# Patient Record
Sex: Female | Born: 1975 | Race: White | Hispanic: No | Marital: Married | Smoking: Never smoker
Health system: Southern US, Community
[De-identification: ages and names within clinical notes are randomized; demographics above are authoritative.]

## PROBLEM LIST (undated history)

## (undated) ENCOUNTER — Inpatient Hospital Stay (HOSPITAL_COMMUNITY): Payer: Self-pay

## (undated) DIAGNOSIS — Z8619 Personal history of other infectious and parasitic diseases: Secondary | ICD-10-CM

## (undated) DIAGNOSIS — K900 Celiac disease: Secondary | ICD-10-CM

## (undated) DIAGNOSIS — Z9889 Other specified postprocedural states: Secondary | ICD-10-CM

## (undated) DIAGNOSIS — D649 Anemia, unspecified: Secondary | ICD-10-CM

## (undated) DIAGNOSIS — R112 Nausea with vomiting, unspecified: Secondary | ICD-10-CM

## (undated) DIAGNOSIS — Z8739 Personal history of other diseases of the musculoskeletal system and connective tissue: Secondary | ICD-10-CM

## (undated) DIAGNOSIS — IMO0002 Reserved for concepts with insufficient information to code with codable children: Secondary | ICD-10-CM

## (undated) DIAGNOSIS — R87619 Unspecified abnormal cytological findings in specimens from cervix uteri: Secondary | ICD-10-CM

## (undated) HISTORY — DX: Celiac disease: K90.0

## (undated) HISTORY — DX: Unspecified abnormal cytological findings in specimens from cervix uteri: R87.619

## (undated) HISTORY — PX: COLPOSCOPY: SHX161

## (undated) HISTORY — DX: Personal history of other diseases of the musculoskeletal system and connective tissue: Z87.39

## (undated) HISTORY — DX: Anemia, unspecified: D64.9

## (undated) HISTORY — DX: Personal history of other infectious and parasitic diseases: Z86.19

## (undated) HISTORY — DX: Reserved for concepts with insufficient information to code with codable children: IMO0002

---

## 2002-12-27 ENCOUNTER — Encounter: Payer: Self-pay | Admitting: Emergency Medicine

## 2002-12-27 ENCOUNTER — Emergency Department (HOSPITAL_COMMUNITY): Admission: EM | Admit: 2002-12-27 | Discharge: 2002-12-27 | Payer: Self-pay | Admitting: Emergency Medicine

## 2005-06-12 ENCOUNTER — Ambulatory Visit: Payer: Self-pay | Admitting: Family Medicine

## 2005-06-14 ENCOUNTER — Ambulatory Visit (HOSPITAL_COMMUNITY): Admission: RE | Admit: 2005-06-14 | Discharge: 2005-06-14 | Payer: Self-pay | Admitting: Family Medicine

## 2005-07-08 ENCOUNTER — Ambulatory Visit: Payer: Self-pay | Admitting: Family Medicine

## 2005-09-11 ENCOUNTER — Ambulatory Visit: Payer: Self-pay | Admitting: Internal Medicine

## 2005-09-16 ENCOUNTER — Ambulatory Visit (HOSPITAL_COMMUNITY): Admission: RE | Admit: 2005-09-16 | Discharge: 2005-09-16 | Payer: Self-pay | Admitting: Internal Medicine

## 2006-06-29 IMAGING — CR DG CHEST 2V
2 series · 2 of 2 positions shown · non-contrast
Comparison: none

HISTORY: Cough, positive M SAIFUR and elevated sedimentation rate

CHEST 2 VIEWS:
No prior exam for comparison
Normal heart size, mediastinal contours, and vascularity.
Lungs clear.
No effusion or pneumothorax.
Bones unremarkable.

[view not recorded (1 of 2)]
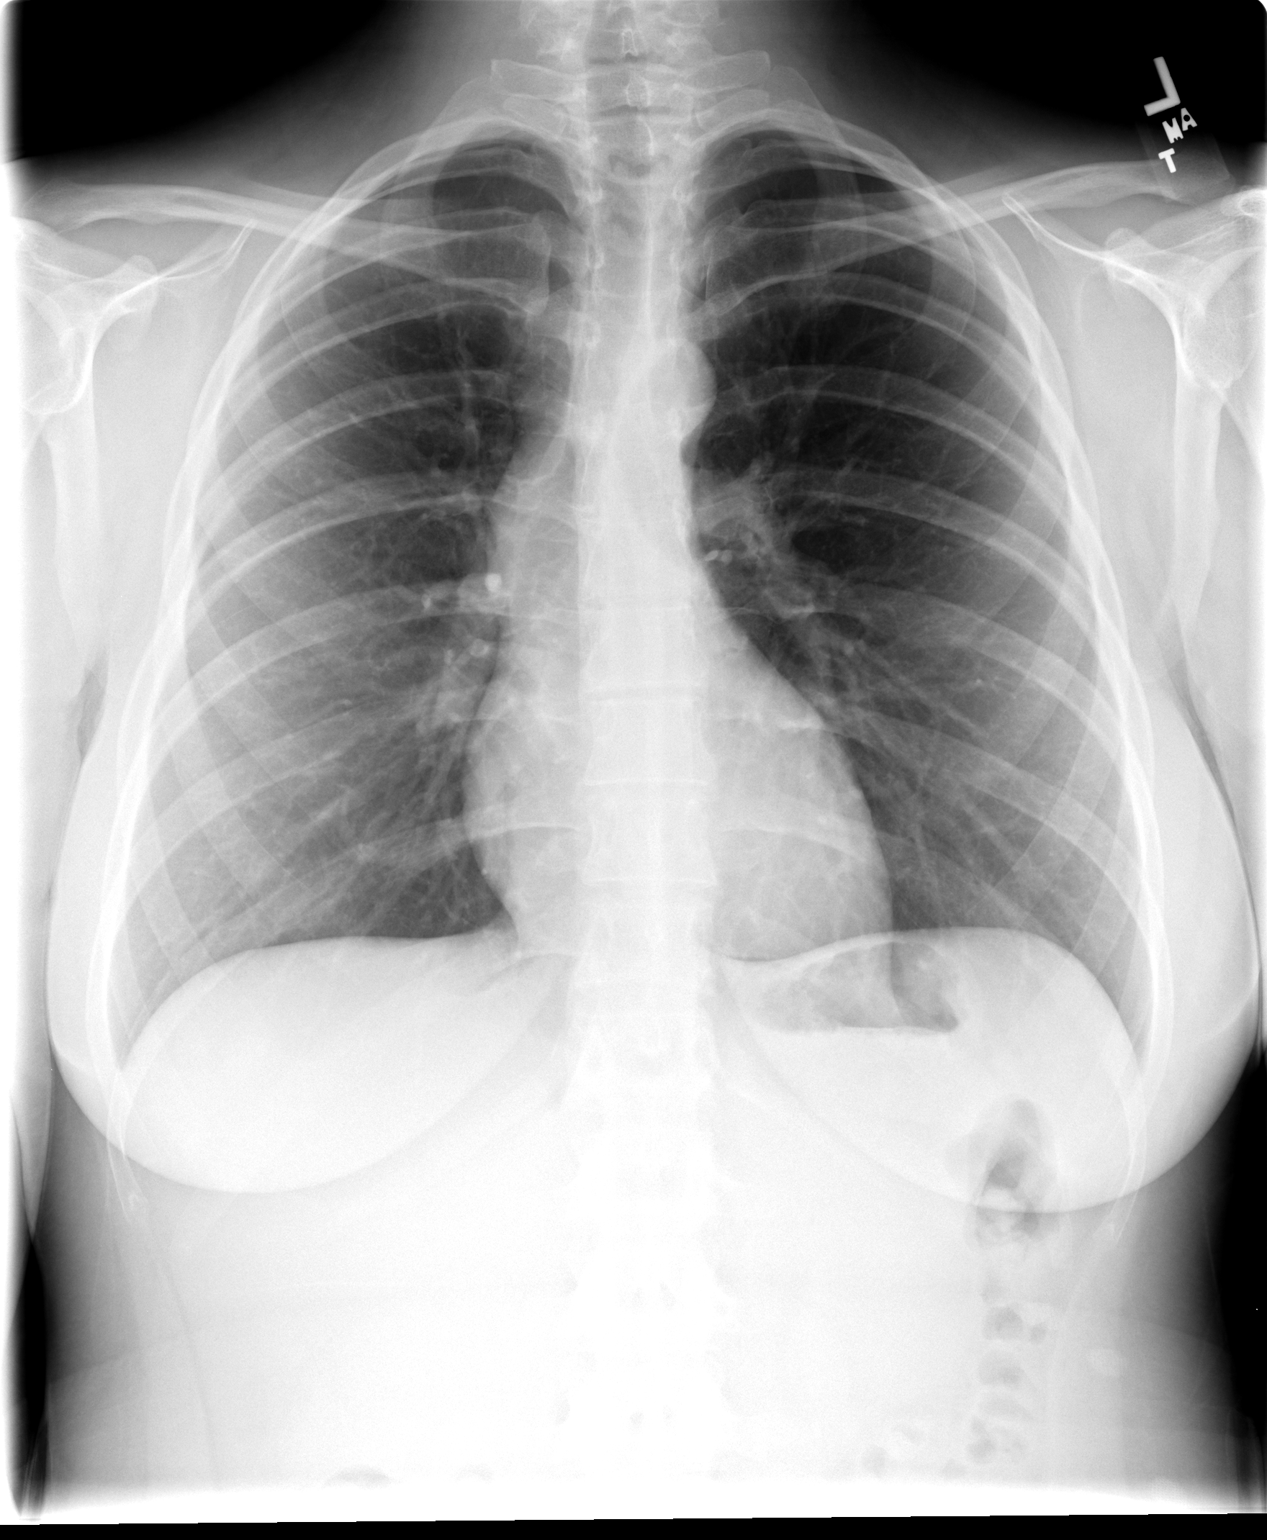

[view not recorded (2 of 2)]
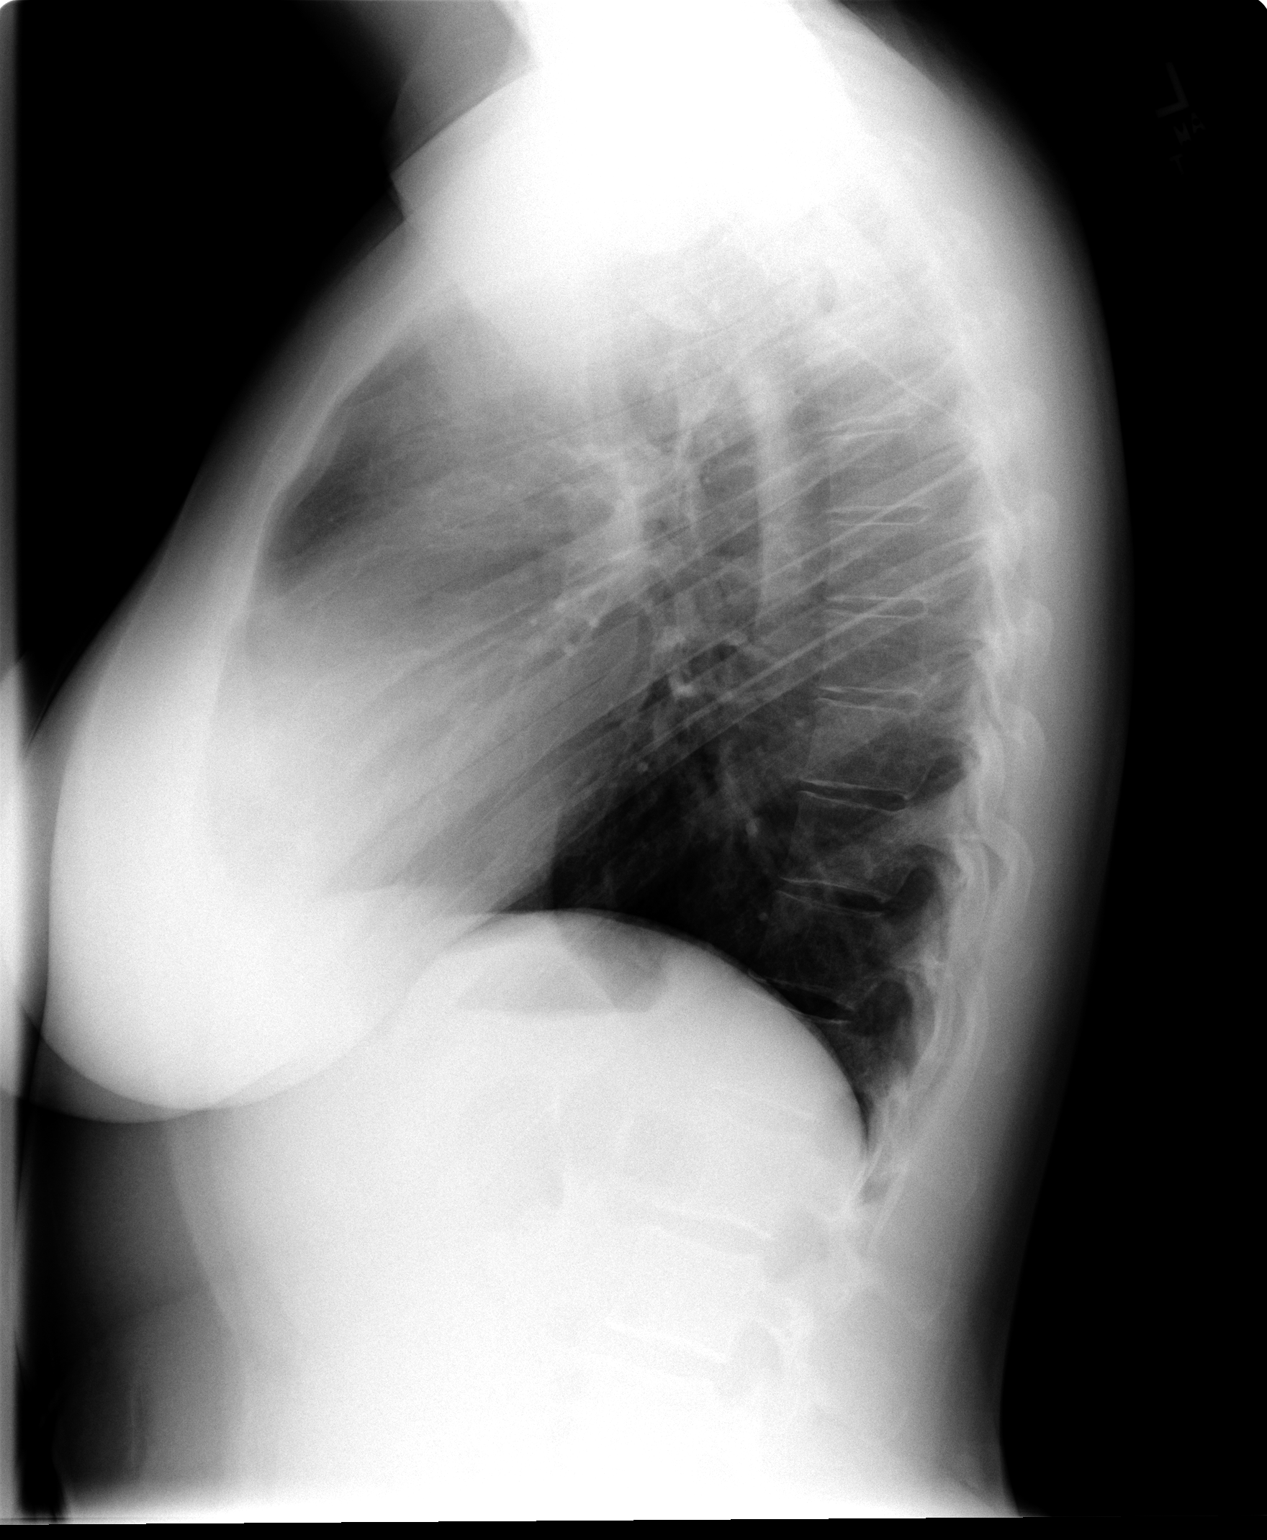

[2 of 2 positions shown; findings below may reference images not displayed]

IMPRESSION: No acute abnormalities.

## 2006-07-02 ENCOUNTER — Emergency Department (HOSPITAL_COMMUNITY): Admission: EM | Admit: 2006-07-02 | Discharge: 2006-07-02 | Payer: Self-pay | Admitting: Family Medicine

## 2006-11-26 ENCOUNTER — Emergency Department (HOSPITAL_COMMUNITY): Admission: EM | Admit: 2006-11-26 | Discharge: 2006-11-26 | Payer: Self-pay | Admitting: Family Medicine

## 2007-05-22 ENCOUNTER — Emergency Department (HOSPITAL_COMMUNITY): Admission: EM | Admit: 2007-05-22 | Discharge: 2007-05-22 | Payer: Self-pay | Admitting: Emergency Medicine

## 2007-08-18 ENCOUNTER — Emergency Department (HOSPITAL_COMMUNITY): Admission: EM | Admit: 2007-08-18 | Discharge: 2007-08-18 | Payer: Self-pay | Admitting: Emergency Medicine

## 2007-10-19 ENCOUNTER — Emergency Department (HOSPITAL_COMMUNITY): Admission: EM | Admit: 2007-10-19 | Discharge: 2007-10-19 | Payer: Self-pay | Admitting: Family Medicine

## 2008-10-13 ENCOUNTER — Emergency Department (HOSPITAL_COMMUNITY): Admission: EM | Admit: 2008-10-13 | Discharge: 2008-10-13 | Payer: Self-pay | Admitting: Family Medicine

## 2009-04-26 ENCOUNTER — Emergency Department (HOSPITAL_COMMUNITY): Admission: EM | Admit: 2009-04-26 | Discharge: 2009-04-26 | Payer: Self-pay | Admitting: Family Medicine

## 2009-12-27 ENCOUNTER — Inpatient Hospital Stay (HOSPITAL_COMMUNITY): Admission: AD | Admit: 2009-12-27 | Discharge: 2009-12-27 | Payer: Self-pay | Admitting: Obstetrics and Gynecology

## 2010-01-03 ENCOUNTER — Encounter (INDEPENDENT_AMBULATORY_CARE_PROVIDER_SITE_OTHER): Payer: Self-pay | Admitting: Obstetrics and Gynecology

## 2010-01-03 ENCOUNTER — Inpatient Hospital Stay (HOSPITAL_COMMUNITY): Admission: RE | Admit: 2010-01-03 | Discharge: 2010-01-06 | Payer: Self-pay | Admitting: Obstetrics and Gynecology

## 2010-10-29 ENCOUNTER — Other Ambulatory Visit: Payer: Self-pay | Admitting: Dermatology

## 2010-11-27 LAB — COMPREHENSIVE METABOLIC PANEL
ALT: 16 U/L (ref 0–35)
AST: 20 U/L (ref 0–37)
Albumin: 2.9 g/dL — ABNORMAL LOW (ref 3.5–5.2)
Alkaline Phosphatase: 89 U/L (ref 39–117)
BUN: 9 mg/dL (ref 6–23)
CO2: 24 mEq/L (ref 19–32)
Calcium: 9.6 mg/dL (ref 8.4–10.5)
Chloride: 103 mEq/L (ref 96–112)
Creatinine, Ser: 0.77 mg/dL (ref 0.4–1.2)
GFR calc Af Amer: >60 mL/min (ref >60)
GFR calc non Af Amer: >60 mL/min (ref >60)
Glucose, Bld: 79 mg/dL (ref 70–99)
Potassium: 4 mEq/L (ref 3.5–5.1)
Sodium: 135 mEq/L (ref 135–145)
Total Bilirubin: 0.2 mg/dL — ABNORMAL LOW (ref 0.3–1.2)
Total Protein: 7 g/dL (ref 6.0–8.3)

## 2010-11-27 LAB — CBC
HCT: 20.7 % — ABNORMAL LOW (ref 36.0–46.0)
HCT: 27.9 % — ABNORMAL LOW (ref 36.0–46.0)
HCT: 28.7 % — ABNORMAL LOW (ref 36.0–46.0)
Hemoglobin: 7.3 g/dL — ABNORMAL LOW (ref 12.0–15.0)
Hemoglobin: 9.5 g/dL — ABNORMAL LOW (ref 12.0–15.0)
Hemoglobin: 9.7 g/dL — ABNORMAL LOW (ref 12.0–15.0)
MCHC: 33.7 g/dL (ref 30.0–36.0)
MCHC: 34.1 g/dL (ref 30.0–36.0)
MCHC: 35.1 g/dL (ref 30.0–36.0)
MCV: 86.3 fL (ref 78.0–100.0)
MCV: 86.7 fL (ref 78.0–100.0)
MCV: 88.6 fL (ref 78.0–100.0)
Platelets: 141 10*3/uL — ABNORMAL LOW (ref 150–400)
Platelets: 192 10*3/uL (ref 150–400)
Platelets: 203 10*3/uL (ref 150–400)
RBC: 2.34 MIL/uL — ABNORMAL LOW (ref 3.87–5.11)
RBC: 3.22 MIL/uL — ABNORMAL LOW (ref 3.87–5.11)
RBC: 3.33 MIL/uL — ABNORMAL LOW (ref 3.87–5.11)
RDW: 16 % — ABNORMAL HIGH (ref 11.5–15.5)
RDW: 16 % — ABNORMAL HIGH (ref 11.5–15.5)
RDW: 16.3 % — ABNORMAL HIGH (ref 11.5–15.5)
WBC: 12.4 10*3/uL — ABNORMAL HIGH (ref 4.0–10.5)
WBC: 13 10*3/uL — ABNORMAL HIGH (ref 4.0–10.5)
WBC: 13.4 10*3/uL — ABNORMAL HIGH (ref 4.0–10.5)

## 2010-11-27 LAB — CROSSMATCH
ABO/RH(D): A POS
Antibody Screen: NEGATIVE

## 2010-11-27 LAB — LACTATE DEHYDROGENASE: LDH: 124 U/L (ref 94–250)

## 2010-11-27 LAB — ABO/RH: ABO/RH(D): A POS

## 2010-11-27 LAB — RPR: RPR Ser Ql: NONREACTIVE

## 2010-11-27 LAB — URIC ACID: Uric Acid, Serum: 5.9 mg/dL (ref 2.4–7.0)

## 2010-12-15 LAB — POCT PREGNANCY, URINE: Preg Test, Ur: NEGATIVE

## 2010-12-25 LAB — POCT RAPID STREP A (OFFICE): Streptococcus, Group A Screen (Direct): NEGATIVE

## 2011-06-17 LAB — I-STAT 8, (EC8 V) (CONVERTED LAB)
Acid-base deficit: 2
BUN: 14
Bicarbonate: 23.9
Chloride: 104
Glucose, Bld: 142 — ABNORMAL HIGH
HCT: 39
Hemoglobin: 13.3
Operator id: 116391
Potassium: 3.6
Sodium: 137
TCO2: 25
pCO2, Ven: 43.8 — ABNORMAL LOW
pH, Ven: 7.345 — ABNORMAL HIGH

## 2011-06-17 LAB — DIFFERENTIAL
Basophils Absolute: 0
Basophils Relative: 1
Eosinophils Absolute: 0.1 — ABNORMAL LOW
Eosinophils Relative: 2
Lymphocytes Relative: 28
Lymphs Abs: 1
Monocytes Absolute: 0.4
Monocytes Relative: 12
Neutro Abs: 2.1
Neutrophils Relative %: 58

## 2011-06-17 LAB — POCT I-STAT CREATININE
Creatinine, Ser: 0.9
Operator id: 116391

## 2011-06-17 LAB — CBC
HCT: 35.8 — ABNORMAL LOW
Hemoglobin: 12.1
MCHC: 33.7
MCV: 83.7
Platelets: 241
RBC: 4.28
RDW: 13.1
WBC: 3.6 — ABNORMAL LOW

## 2011-06-21 LAB — POCT RAPID STREP A: Streptococcus, Group A Screen (Direct): NEGATIVE

## 2011-07-19 LAB — OB RESULTS CONSOLE HEPATITIS B SURFACE ANTIGEN: Hepatitis B Surface Ag: NEGATIVE

## 2011-07-19 LAB — OB RESULTS CONSOLE ABO/RH
RH Type: NEGATIVE
RH Type: POSITIVE

## 2011-07-19 LAB — OB RESULTS CONSOLE GC/CHLAMYDIA
Chlamydia: NEGATIVE
Gonorrhea: NEGATIVE

## 2011-07-19 LAB — OB RESULTS CONSOLE HIV ANTIBODY (ROUTINE TESTING): HIV: NONREACTIVE

## 2011-07-19 LAB — OB RESULTS CONSOLE RUBELLA ANTIBODY, IGM: Rubella: IMMUNE

## 2011-07-19 LAB — OB RESULTS CONSOLE RPR: RPR: NONREACTIVE

## 2012-01-20 LAB — OB RESULTS CONSOLE GBS: GBS: NEGATIVE

## 2012-02-07 ENCOUNTER — Encounter (HOSPITAL_COMMUNITY): Payer: Self-pay | Admitting: *Deleted

## 2012-02-07 ENCOUNTER — Telehealth (HOSPITAL_COMMUNITY): Payer: Self-pay | Admitting: *Deleted

## 2012-02-07 NOTE — Telephone Encounter (Signed)
Preadmission screen  

## 2012-02-08 ENCOUNTER — Other Ambulatory Visit (HOSPITAL_COMMUNITY): Payer: Self-pay | Admitting: Obstetrics and Gynecology

## 2012-02-10 ENCOUNTER — Observation Stay (HOSPITAL_COMMUNITY)
Admission: RE | Admit: 2012-02-10 | Discharge: 2012-02-10 | Disposition: A | Discharge status: Home / Self Care | Payer: Commercial Managed Care - PPO | Source: Ambulatory Visit | Attending: Obstetrics and Gynecology | Admitting: Obstetrics and Gynecology

## 2012-02-10 ENCOUNTER — Encounter (HOSPITAL_COMMUNITY): Payer: Self-pay

## 2012-02-10 DIAGNOSIS — O321XX0 Maternal care for breech presentation, not applicable or unspecified: Principal | ICD-10-CM | POA: Insufficient documentation

## 2012-02-10 MED ORDER — LACTATED RINGERS IV SOLN: INTRAVENOUS | Status: DC

## 2012-02-10 NOTE — Progress Notes (Signed)
Patient ID: Cristina Armstrong, female   DOB: 26-May-1976, 36 y.o.   MRN: [id number] [redacted][id number][redacted]d   Presents for presumed ECV, bedside US>>vtx, cx 1-2/long/post  Discussed LD pit induction for VBAC at 39 weeks, our office will sched

## 2012-02-10 NOTE — Progress Notes (Signed)
Pt to be discharged home; waiting for Dr. Marcelle Overlie to place discharge orders

## 2012-02-12 NOTE — H&P (Signed)
NAMEJOSLYN, RAMOS NO.:  [id number]  MEDICAL RECORD NO.:  [id number]  LOCATION:  9172                          FACILITY:  [facility]  PHYSICIAN:  Duke Salvia. Marcelle Overlie, M.D.DATE OF BIRTH:  Dec 11, 1975  DATE OF ADMISSION:  02/10/2012 DATE OF DISCHARGE:  02/10/2012                             HISTORY & PHYSICAL   CHIEF COMPLAINT:  Transverse presentation on near term.  HISTORY OF PRESENT ILLNESS:  A 36 year old, G2, P1, previous cesarean section on 2011 for a low-lying placenta, transverse lie, turned out to be vertex with the day of section.  The patient is now living in [place], but Cristina Armstrong is unable to accommodate VBAC, so she plans to attempt VBAC here in [place].  Her prenatal course has otherwise been uneventful. GBS was negative, but it was noted recently to have a transverse lie, was scheduled for outpatient ECV.  PAST MEDICAL HISTORY:  Please see the her [facility] form for details.  PHYSICAL EXAMINATION:  VITAL SIGNS:  Temp 98.2, blood pressure 130/84. HEENT:  Unremarkable. NECK:  Supple without masses. LUNGS:  Clear. CARDIOVASCULAR:  Regular rate and rhythm without murmurs, rubs, or gallops noted. BREASTS:  Not examined, term fundal height.  Fetal heart rate 140. Cervix was closed. EXTREMITIES:  Unremarkable. NEUROLOGIC:  Unremarkable.  IMPRESSION: 1. A 38-week intrauterine pregnancy, previous cesarean section,     desires vaginal birth after cesarean section. 2. Transverse lie.  PLAN:  ECV procedure and risks discussed.  ADDENDUM:  When presenting to L and D for proposed ECV, bedside ultrasound confirmed, vertex presentation.  The cervix was 1-2, 25%, vertex, -2.  Membranes intact.  Fetal monitoring was stable.  She was sent to home to have scheduled outpatient induction.     Alvin Rubano M. Marcelle Overlie, M.D.    RMH/MEDQ  D:  02/12/2012  T:  02/12/2012  Job:  [id number]

## 2012-02-12 NOTE — H&P (Signed)
Cristina Armstrong  DICTATION # [id number] CSN# [id number]   Meriel Pica, MD 02/12/2012 9:18 PM

## 2012-02-14 ENCOUNTER — Inpatient Hospital Stay (HOSPITAL_COMMUNITY)
Admission: RE | Admit: 2012-02-14 | Discharge: 2012-02-17 | DRG: 766 | Disposition: A | Discharge status: Home / Self Care | Payer: Commercial Managed Care - PPO | Source: Ambulatory Visit | Attending: Obstetrics and Gynecology | Admitting: Obstetrics and Gynecology

## 2012-02-14 ENCOUNTER — Encounter (HOSPITAL_COMMUNITY): Payer: Self-pay

## 2012-02-14 DIAGNOSIS — O321XX0 Maternal care for breech presentation, not applicable or unspecified: Secondary | ICD-10-CM | POA: Diagnosis not present

## 2012-02-14 DIAGNOSIS — O139 Gestational [pregnancy-induced] hypertension without significant proteinuria, unspecified trimester: Principal | ICD-10-CM | POA: Diagnosis present

## 2012-02-14 DIAGNOSIS — O34219 Maternal care for unspecified type scar from previous cesarean delivery: Secondary | ICD-10-CM | POA: Diagnosis present

## 2012-02-14 DIAGNOSIS — Z98891 History of uterine scar from previous surgery: Secondary | ICD-10-CM

## 2012-02-14 LAB — CBC
HCT: 27.7 % — ABNORMAL LOW (ref 36.0–46.0)
MCHC: 33.9 g/dL (ref 30.0–36.0)
MCV: 82 fL (ref 78.0–100.0)
RDW: 15.6 % — ABNORMAL HIGH (ref 11.5–15.5)

## 2012-02-14 LAB — COMPREHENSIVE METABOLIC PANEL
Albumin: 2.6 g/dL — ABNORMAL LOW (ref 3.5–5.2)
BUN: 13 mg/dL (ref 6–23)
Chloride: 102 mEq/L (ref 96–112)
Creatinine, Ser: 0.75 mg/dL (ref 0.50–1.10)
Total Bilirubin: 0.2 mg/dL — ABNORMAL LOW (ref 0.3–1.2)
Total Protein: 6.5 g/dL (ref 6.0–8.3)

## 2012-02-14 LAB — URIC ACID: Uric Acid, Serum: 6 mg/dL (ref 2.4–7.0)

## 2012-02-14 MED ORDER — OXYTOCIN 20 UNITS IN LACTATED RINGERS INFUSION - SIMPLE
1.0000 m[IU]/min | INTRAVENOUS | Status: DC
  Administered 2012-02-14: 1 m[IU]/min via INTRAVENOUS

## 2012-02-14 MED ORDER — OXYCODONE-ACETAMINOPHEN 5-325 MG PO TABS: 1.0000 | ORAL_TABLET | ORAL | Status: DC | PRN

## 2012-02-14 MED ORDER — ACETAMINOPHEN 325 MG PO TABS: 650.0000 mg | ORAL_TABLET | ORAL | Status: DC | PRN

## 2012-02-14 MED ORDER — ONDANSETRON HCL 4 MG/2ML IJ SOLN: 4.0000 mg | Freq: Four times a day (QID) | INTRAMUSCULAR | Status: DC | PRN

## 2012-02-14 MED ORDER — OXYTOCIN 20 UNITS IN LACTATED RINGERS INFUSION - SIMPLE: 125.0000 mL/h | Freq: Once | INTRAVENOUS | Status: DC

## 2012-02-14 MED ORDER — TERBUTALINE SULFATE 1 MG/ML IJ SOLN: 0.2500 mg | Freq: Once | INTRAMUSCULAR | Status: AC | PRN

## 2012-02-14 MED ORDER — LACTATED RINGERS IV SOLN: 500.0000 mL | INTRAVENOUS | Status: DC | PRN

## 2012-02-14 MED ORDER — OXYTOCIN BOLUS FROM INFUSION
500.0000 mL | Freq: Once | INTRAVENOUS | Status: DC
  Filled 2012-02-14: qty 500

## 2012-02-14 MED ORDER — LIDOCAINE HCL (PF) 1 % IJ SOLN: 30.0000 mL | INTRAMUSCULAR | Status: DC | PRN

## 2012-02-14 MED ORDER — OXYTOCIN 20 UNITS IN LACTATED RINGERS INFUSION - SIMPLE
INTRAVENOUS | Status: AC
  Filled 2012-02-14: qty 1000

## 2012-02-14 MED ORDER — FLEET ENEMA 7-19 GM/118ML RE ENEM: 1.0000 | ENEMA | RECTAL | Status: DC | PRN

## 2012-02-14 MED ORDER — CITRIC ACID-SODIUM CITRATE 334-500 MG/5ML PO SOLN
30.0000 mL | ORAL | Status: DC | PRN
  Administered 2012-02-15: 30 mL via ORAL
  Filled 2012-02-14: qty 15

## 2012-02-14 MED ORDER — IBUPROFEN 600 MG PO TABS: 600.0000 mg | ORAL_TABLET | Freq: Four times a day (QID) | ORAL | Status: DC | PRN

## 2012-02-14 MED ORDER — LACTATED RINGERS IV SOLN
INTRAVENOUS | Status: DC
  Administered 2012-02-15 (×2): via INTRAVENOUS

## 2012-02-14 MED ORDER — BUTORPHANOL TARTRATE 2 MG/ML IJ SOLN: 1.0000 mg | INTRAMUSCULAR | Status: DC | PRN

## 2012-02-14 NOTE — H&P (Signed)
Cristina Armstrong is a 36 y.o. female presenting for induction of labor. BPs up in office. No HA or vision change or epigastric pain. No ROM/bleeding.  S/P LTCS. Patient wants to attempt VBAC. Maternal Medical History:  Fetal activity: Perceived fetal activity is normal.      OB History    Grav Para Term Preterm Abortions TAB SAB Ect Mult Living   2 1 1  0 0 0 0 0 0 1     Past Medical History  Diagnosis Date  . Abnormal Pap smear   . History of chicken pox   . Hx of osteopenia   . Anemia   . Celiac disease    Past Surgical History  Procedure Date  . Cesarean section   . Colposcopy    Family History: family history includes Cancer in her maternal grandfather, mother, and paternal grandmother; Heart disease in her maternal grandmother; Parkinsonism in her father; and Stroke in her maternal grandmother. Social History:  reports that she has never smoked. She has never used smokeless tobacco. She reports that she does not drink alcohol or use illicit drugs.  Review of Systems  Eyes: Negative for blurred vision.  Gastrointestinal: Negative for abdominal pain.  Neurological: Negative for headaches.    Dilation: 2 Effacement (%): 30 Station: -3 Exam by:: Dr. Henderson Cloud Blood pressure 150/90, pulse 100, temperature 98.4 F (36.9 C), temperature source Oral, resp. rate 18, height 5\' 2"  (1.575 m), weight 93.895 kg (207 lb), last menstrual period 05/09/2011. Maternal Exam:  Uterine Assessment: Contraction strength is mild.  Contraction frequency is irregular.   Abdomen: Fetal presentation: vertex     Fetal Exam Fetal Monitor Review: Pattern: accelerations present.       Physical Exam  Cardiovascular: Normal rate and regular rhythm.   Respiratory: Effort normal and breath sounds normal.  GI: Soft. There is no tenderness.  Neurological: She has normal reflexes.    Prenatal labs: ABO, Rh: A/Negative/-- (11/09 0000) Antibody:   Rubella: Immune (11/09 0000) RPR: Nonreactive  (11/09 0000)  HBsAg: Negative (11/09 0000)  HIV: Non-reactive (11/09 0000)  GBS: Negative (05/13 0000)   Assessment/Plan: 36 yo MWF G2P1 at 32 1/7 weeks with gestational hypertension and previous LTCS. D/W patient options of delivery. Patient wants to attempt VBAC. D/W patient and husband in office and again in hospital risks of attempted VBAC including uterine rupture, bleeding/transfusion-HIV/Hep, fetal distress with morbidity/mortality, failed induction, emergency c/s, hysterectomy. Patient states she understands and wants to proceed. Will check labs and watch BP. Low dose pitocin this evening.   Tomoya Ringwald II,Cristina Armstrong E 02/14/2012, 8:45 PM

## 2012-02-15 ENCOUNTER — Encounter (HOSPITAL_COMMUNITY): Payer: Self-pay

## 2012-02-15 ENCOUNTER — Inpatient Hospital Stay (HOSPITAL_COMMUNITY): Payer: Commercial Managed Care - PPO | Admitting: Anesthesiology

## 2012-02-15 ENCOUNTER — Encounter (HOSPITAL_COMMUNITY): Payer: Self-pay | Admitting: Anesthesiology

## 2012-02-15 ENCOUNTER — Encounter (HOSPITAL_COMMUNITY)
Admission: RE | Disposition: A | Discharge status: Home / Self Care | Payer: Self-pay | Source: Ambulatory Visit | Attending: Obstetrics and Gynecology

## 2012-02-15 DIAGNOSIS — Z98891 History of uterine scar from previous surgery: Secondary | ICD-10-CM

## 2012-02-15 DIAGNOSIS — O139 Gestational [pregnancy-induced] hypertension without significant proteinuria, unspecified trimester: Secondary | ICD-10-CM | POA: Diagnosis present

## 2012-02-15 DIAGNOSIS — O321XX0 Maternal care for breech presentation, not applicable or unspecified: Secondary | ICD-10-CM | POA: Diagnosis not present

## 2012-02-15 SURGERY — Surgical Case
Anesthesia: Spinal | Site: Abdomen | Wound class: Clean Contaminated

## 2012-02-15 MED ORDER — SENNOSIDES-DOCUSATE SODIUM 8.6-50 MG PO TABS
2.0000 | ORAL_TABLET | Freq: Every day | ORAL | Status: DC
  Administered 2012-02-16: 2 via ORAL

## 2012-02-15 MED ORDER — OXYTOCIN 10 UNIT/ML IJ SOLN
INTRAMUSCULAR | Status: AC
  Filled 2012-02-15: qty 4

## 2012-02-15 MED ORDER — SIMETHICONE 80 MG PO CHEW: 80.0000 mg | CHEWABLE_TABLET | ORAL | Status: DC | PRN

## 2012-02-15 MED ORDER — ONDANSETRON HCL 4 MG/2ML IJ SOLN
INTRAMUSCULAR | Status: DC | PRN
  Administered 2012-02-15: 4 mg via INTRAVENOUS

## 2012-02-15 MED ORDER — OXYTOCIN 20 UNITS IN LACTATED RINGERS INFUSION - SIMPLE: 1.0000 m[IU]/min | INTRAVENOUS | Status: DC

## 2012-02-15 MED ORDER — OXYTOCIN 20 UNITS IN LACTATED RINGERS INFUSION - SIMPLE
INTRAVENOUS | Status: DC | PRN
  Administered 2012-02-15: 20 [IU] via INTRAVENOUS

## 2012-02-15 MED ORDER — DIPHENHYDRAMINE HCL 25 MG PO CAPS: 25.0000 mg | ORAL_CAPSULE | ORAL | Status: DC | PRN

## 2012-02-15 MED ORDER — LACTATED RINGERS IV SOLN
INTRAVENOUS | Status: DC
  Administered 2012-02-16: 05:00:00 via INTRAVENOUS

## 2012-02-15 MED ORDER — EPHEDRINE SULFATE 50 MG/ML IJ SOLN
INTRAMUSCULAR | Status: DC | PRN
  Administered 2012-02-15: 10 mg via INTRAVENOUS
  Administered 2012-02-15: 15 mg via INTRAVENOUS

## 2012-02-15 MED ORDER — MORPHINE SULFATE 0.5 MG/ML IJ SOLN
INTRAMUSCULAR | Status: AC
  Filled 2012-02-15: qty 10

## 2012-02-15 MED ORDER — IBUPROFEN 600 MG PO TABS
600.0000 mg | ORAL_TABLET | Freq: Four times a day (QID) | ORAL | Status: DC
  Administered 2012-02-16 – 2012-02-17 (×5): 600 mg via ORAL
  Filled 2012-02-15 (×6): qty 1

## 2012-02-15 MED ORDER — BUPIVACAINE IN DEXTROSE 0.75-8.25 % IT SOLN
INTRATHECAL | Status: DC | PRN
  Administered 2012-02-15: 1.4 mg via INTRATHECAL

## 2012-02-15 MED ORDER — DIPHENHYDRAMINE HCL 25 MG PO CAPS: 25.0000 mg | ORAL_CAPSULE | Freq: Four times a day (QID) | ORAL | Status: DC | PRN

## 2012-02-15 MED ORDER — ONDANSETRON HCL 4 MG PO TABS: 4.0000 mg | ORAL_TABLET | ORAL | Status: DC | PRN

## 2012-02-15 MED ORDER — CEFAZOLIN SODIUM-DEXTROSE 2-3 GM-% IV SOLR
2.0000 g | Freq: Once | INTRAVENOUS | Status: DC
  Administered 2012-02-15: 2 g via INTRAVENOUS
  Filled 2012-02-15: qty 50

## 2012-02-15 MED ORDER — OXYCODONE-ACETAMINOPHEN 5-325 MG PO TABS: 1.0000 | ORAL_TABLET | ORAL | Status: DC | PRN

## 2012-02-15 MED ORDER — OXYTOCIN 20 UNITS IN LACTATED RINGERS INFUSION - SIMPLE
125.0000 mL/h | INTRAVENOUS | Status: AC
  Administered 2012-02-15: 125 mL/h via INTRAVENOUS
  Filled 2012-02-15: qty 1000

## 2012-02-15 MED ORDER — MEPERIDINE HCL 25 MG/ML IJ SOLN: 6.2500 mg | INTRAMUSCULAR | Status: DC | PRN

## 2012-02-15 MED ORDER — KETOROLAC TROMETHAMINE 30 MG/ML IJ SOLN
15.0000 mg | Freq: Once | INTRAMUSCULAR | Status: AC | PRN
  Administered 2012-02-15: 30 mg via INTRAVENOUS

## 2012-02-15 MED ORDER — ONDANSETRON HCL 4 MG/2ML IJ SOLN
INTRAMUSCULAR | Status: AC
  Filled 2012-02-15: qty 2

## 2012-02-15 MED ORDER — METOCLOPRAMIDE HCL 5 MG/ML IJ SOLN: 10.0000 mg | Freq: Three times a day (TID) | INTRAMUSCULAR | Status: DC | PRN

## 2012-02-15 MED ORDER — ONDANSETRON HCL 4 MG/2ML IJ SOLN: 4.0000 mg | INTRAMUSCULAR | Status: DC | PRN

## 2012-02-15 MED ORDER — TETANUS-DIPHTH-ACELL PERTUSSIS 5-2.5-18.5 LF-MCG/0.5 IM SUSP: 0.5000 mL | Freq: Once | INTRAMUSCULAR | Status: DC

## 2012-02-15 MED ORDER — MENTHOL 3 MG MT LOZG: 1.0000 | LOZENGE | OROMUCOSAL | Status: DC | PRN

## 2012-02-15 MED ORDER — KETOROLAC TROMETHAMINE 30 MG/ML IJ SOLN: 30.0000 mg | Freq: Four times a day (QID) | INTRAMUSCULAR | Status: AC | PRN

## 2012-02-15 MED ORDER — SCOPOLAMINE 1 MG/3DAYS TD PT72
1.0000 | MEDICATED_PATCH | Freq: Once | TRANSDERMAL | Status: DC
  Administered 2012-02-15: 1.5 mg via TRANSDERMAL

## 2012-02-15 MED ORDER — DIPHENHYDRAMINE HCL 50 MG/ML IJ SOLN: 12.5000 mg | INTRAMUSCULAR | Status: DC | PRN

## 2012-02-15 MED ORDER — NALBUPHINE HCL 10 MG/ML IJ SOLN
5.0000 mg | INTRAMUSCULAR | Status: DC | PRN
  Filled 2012-02-15: qty 1

## 2012-02-15 MED ORDER — SODIUM CHLORIDE 0.9 % IV SOLN: 1.0000 ug/kg/h | INTRAVENOUS | Status: DC | PRN

## 2012-02-15 MED ORDER — DIBUCAINE 1 % RE OINT: 1.0000 "application " | TOPICAL_OINTMENT | RECTAL | Status: DC | PRN

## 2012-02-15 MED ORDER — MORPHINE SULFATE (PF) 0.5 MG/ML IJ SOLN
INTRAMUSCULAR | Status: DC | PRN
  Administered 2012-02-15: .1 mg via INTRATHECAL

## 2012-02-15 MED ORDER — SIMETHICONE 80 MG PO CHEW
80.0000 mg | CHEWABLE_TABLET | Freq: Three times a day (TID) | ORAL | Status: DC
  Administered 2012-02-16 – 2012-02-17 (×5): 80 mg via ORAL

## 2012-02-15 MED ORDER — EPHEDRINE 5 MG/ML INJ
INTRAVENOUS | Status: AC
  Filled 2012-02-15: qty 10

## 2012-02-15 MED ORDER — PROMETHAZINE HCL 25 MG/ML IJ SOLN: 6.2500 mg | INTRAMUSCULAR | Status: DC | PRN

## 2012-02-15 MED ORDER — LACTATED RINGERS IV SOLN
INTRAVENOUS | Status: DC | PRN
  Administered 2012-02-15 (×2): via INTRAVENOUS

## 2012-02-15 MED ORDER — FENTANYL CITRATE 0.05 MG/ML IJ SOLN
INTRAMUSCULAR | Status: AC
  Filled 2012-02-15: qty 2

## 2012-02-15 MED ORDER — SCOPOLAMINE 1 MG/3DAYS TD PT72
MEDICATED_PATCH | TRANSDERMAL | Status: AC
  Administered 2012-02-15: 1.5 mg via TRANSDERMAL
  Filled 2012-02-15: qty 1

## 2012-02-15 MED ORDER — LANOLIN HYDROUS EX OINT: 1.0000 "application " | TOPICAL_OINTMENT | CUTANEOUS | Status: DC | PRN

## 2012-02-15 MED ORDER — KETOROLAC TROMETHAMINE 60 MG/2ML IM SOLN
60.0000 mg | Freq: Once | INTRAMUSCULAR | Status: AC | PRN
  Filled 2012-02-15: qty 2

## 2012-02-15 MED ORDER — FENTANYL CITRATE 0.05 MG/ML IJ SOLN
INTRAMUSCULAR | Status: DC | PRN
  Administered 2012-02-15: 12.5 ug via INTRATHECAL

## 2012-02-15 MED ORDER — HYDROMORPHONE HCL PF 1 MG/ML IJ SOLN: 0.2500 mg | INTRAMUSCULAR | Status: DC | PRN

## 2012-02-15 MED ORDER — KETOROLAC TROMETHAMINE 30 MG/ML IJ SOLN
INTRAMUSCULAR | Status: AC
  Administered 2012-02-15: 30 mg via INTRAVENOUS
  Filled 2012-02-15: qty 1

## 2012-02-15 MED ORDER — PRENATAL MULTIVITAMIN CH
1.0000 | ORAL_TABLET | Freq: Every day | ORAL | Status: DC
  Administered 2012-02-16 – 2012-02-17 (×2): 1 via ORAL
  Filled 2012-02-15 (×2): qty 1

## 2012-02-15 MED ORDER — ZOLPIDEM TARTRATE 5 MG PO TABS: 5.0000 mg | ORAL_TABLET | Freq: Every evening | ORAL | Status: DC | PRN

## 2012-02-15 MED ORDER — WITCH HAZEL-GLYCERIN EX PADS: 1.0000 "application " | MEDICATED_PAD | CUTANEOUS | Status: DC | PRN

## 2012-02-15 MED ORDER — CEFAZOLIN SODIUM 1-5 GM-% IV SOLN
INTRAVENOUS | Status: DC | PRN
  Administered 2012-02-15: 2 g via INTRAVENOUS

## 2012-02-15 MED ORDER — SODIUM CHLORIDE 0.9 % IJ SOLN: 3.0000 mL | INTRAMUSCULAR | Status: DC | PRN

## 2012-02-15 MED ORDER — NALOXONE HCL 0.4 MG/ML IJ SOLN: 0.4000 mg | INTRAMUSCULAR | Status: DC | PRN

## 2012-02-15 MED ORDER — ONDANSETRON HCL 4 MG/2ML IJ SOLN: 4.0000 mg | Freq: Three times a day (TID) | INTRAMUSCULAR | Status: DC | PRN

## 2012-02-15 MED ORDER — DIPHENHYDRAMINE HCL 50 MG/ML IJ SOLN: 25.0000 mg | INTRAMUSCULAR | Status: DC | PRN

## 2012-02-15 SURGICAL SUPPLY — 22 items
CLOTH BEACON ORANGE TIMEOUT ST (SAFETY) ×2 IMPLANT
DRESSING TELFA 8X3 (GAUZE/BANDAGES/DRESSINGS) ×2 IMPLANT
ELECT REM PT RETURN 9FT ADLT (ELECTROSURGICAL) ×2
ELECTRODE REM PT RTRN 9FT ADLT (ELECTROSURGICAL) ×1 IMPLANT
GAUZE SPONGE 4X4 12PLY STRL LF (GAUZE/BANDAGES/DRESSINGS) ×2 IMPLANT
GLOVE BIO SURGEON STRL SZ8 (GLOVE) ×4 IMPLANT
GOWN PREVENTION PLUS LG XLONG (DISPOSABLE) ×4 IMPLANT
KIT ABG SYR 3ML LUER SLIP (SYRINGE) ×2 IMPLANT
NEEDLE HYPO 25X5/8 SAFETYGLIDE (NEEDLE) ×2 IMPLANT
NS IRRIG 1000ML POUR BTL (IV SOLUTION) ×2 IMPLANT
PACK C SECTION WH (CUSTOM PROCEDURE TRAY) ×2 IMPLANT
PAD ABD 7.5X8 STRL (GAUZE/BANDAGES/DRESSINGS) ×2 IMPLANT
SLEEVE SCD COMPRESS KNEE MED (MISCELLANEOUS) ×2 IMPLANT
STAPLER VISISTAT 35W (STAPLE) ×2 IMPLANT
STRIP CLOSURE SKIN 1/2X4 (GAUZE/BANDAGES/DRESSINGS) ×2 IMPLANT
SUT MNCRL 0 VIOLET CTX 36 (SUTURE) ×4 IMPLANT
SUT MONOCRYL 0 CTX 36 (SUTURE) ×4
SUT PDS AB 0 CTX 60 (SUTURE) ×2 IMPLANT
SUT PLAIN 2 0 XLH (SUTURE) ×2 IMPLANT
TAPE CLOTH SURG 4X10 WHT LF (GAUZE/BANDAGES/DRESSINGS) ×2 IMPLANT
TOWEL OR 17X24 6PK STRL BLUE (TOWEL DISPOSABLE) ×4 IMPLANT
TRAY FOLEY CATH 14FR (SET/KITS/TRAYS/PACK) ×2 IMPLANT

## 2012-02-15 NOTE — Anesthesia Procedure Notes (Signed)
Spinal  Patient location during procedure: OR Start time: 02/15/2012 4:30 PM End time: 02/15/2012 4:34 PM Staffing Anesthesiologist: Sandrea Hughs Performed by: anesthesiologist  Preanesthetic Checklist Completed: patient identified, site marked, surgical consent, pre-op evaluation, timeout performed, IV checked, risks and benefits discussed and monitors and equipment checked Spinal Block Patient position: sitting Prep: DuraPrep Patient monitoring: heart rate, cardiac monitor, continuous pulse ox and blood pressure Approach: midline Location: L3-4 Injection technique: single-shot Needle Needle type: Sprotte  Needle gauge: 24 G Needle length: 9 cm Needle insertion depth: 6 cm Assessment Sensory level: T8

## 2012-02-15 NOTE — Progress Notes (Signed)
Dr Henderson Cloud notified of pt progress, pain level, UCs, FHR tracing with moderate variability, and amt of pit.

## 2012-02-15 NOTE — Anesthesia Postprocedure Evaluation (Signed)
Anesthesia Post Note  Patient: Cristina Armstrong  Procedure(s) Performed: Procedure(s) (LRB): CESAREAN SECTION (N/A)  Anesthesia type: Spinal  Patient location: PACU  Post pain: Pain level controlled  Post assessment: Post-op Vital signs reviewed  Last Vitals:  Filed Vitals:   02/15/12 1800  BP:   Pulse: 78  Temp:   Resp: 20    Post vital signs: Reviewed  Level of consciousness: awake  Complications: No apparent anesthesia complications

## 2012-02-15 NOTE — Anesthesia Preprocedure Evaluation (Signed)
Anesthesia Evaluation  Patient identified by MRN, date of birth, ID band Patient awake    Reviewed: Allergy & Precautions, H&P , NPO status , Patient's Chart, lab work & pertinent test results  Airway Mallampati: II TM Distance: >3 FB Neck ROM: full    Dental No notable dental hx.    Pulmonary neg pulmonary ROS,    Pulmonary exam normal       Cardiovascular negative cardio ROS      Neuro/Psych negative neurological ROS  negative psych ROS   GI/Hepatic negative GI ROS, Neg liver ROS,   Endo/Other  Morbid obesity  Renal/GU negative Renal ROS  negative genitourinary   Musculoskeletal negative musculoskeletal ROS (+)   Abdominal Normal abdominal exam  (+) + obese,   Peds negative pediatric ROS (+)  Hematology negative hematology ROS (+)   Anesthesia Other Findings   Reproductive/Obstetrics (+) Pregnancy                           Anesthesia Physical Anesthesia Plan  ASA: III and Emergent  Anesthesia Plan: Spinal   Post-op Pain Management:    Induction:   Airway Management Planned:   Additional Equipment:   Intra-op Plan:   Post-operative Plan:   Informed Consent: I have reviewed the patients History and Physical, chart, labs and discussed the procedure including the risks, benefits and alternatives for the proposed anesthesia with the patient or authorized representative who has indicated his/her understanding and acceptance.     Plan Discussed with: CRNA and Surgeon  Anesthesia Plan Comments:         Anesthesia Quick Evaluation

## 2012-02-15 NOTE — Progress Notes (Signed)
FHT 2 min decel after return from BR, now reactive with no further decels  UCs q 4-5 min  BP OK Labs OK  Will increase pitocin per protocol

## 2012-02-15 NOTE — Progress Notes (Signed)
Cx no change FHT reactive UCs about q 3 min Increase pitocin per protocol

## 2012-02-15 NOTE — Brief Op Note (Signed)
02/14/2012 - 02/15/2012  5:21 PM  PATIENT:  Cristina Armstrong  36 y.o. female  PRE-OPERATIVE DIAGNOSIS:  Breech Presentation  POST-OPERATIVE DIAGNOSIS:  Breech Presentation  PROCEDURE:  Procedure(s) (LRB):Low Transverse CESAREAN SECTION (N/A)  SURGEON:  Surgeon(s) and Role:    * Leslie Andrea, MD - Primary  PHYSICIAN ASSISTANT:   ASSISTANTS: none   ANESTHESIA:   spinal  EBL:  Total I/O In: 500 [I.V.:500] Out: 750 [Urine:150; Blood:600]  BLOOD ADMINISTERED:none  DRAINS: Urinary Catheter (Foley)   LOCAL MEDICATIONS USED:  NONE  SPECIMEN:  Source of Specimen:  placenta  DISPOSITION OF SPECIMEN:  PATHOLOGY  COUNTS:  YES  TOURNIQUET:  * No tourniquets in log *  DICTATION: .Other Dictation: Dictation Number [id number]  PLAN OF CARE: Admit to inpatient   PATIENT DISPOSITION:  PACU - hemodynamically stable.   Delay start of Pharmacological VTE agent (>24hrs) due to surgical blood loss or risk of bleeding: not applicable

## 2012-02-15 NOTE — Progress Notes (Signed)
Dr. Henderson Cloud at bedside, orders to begin increasing Pitocin by 1 mu/min

## 2012-02-15 NOTE — Progress Notes (Signed)
Cx no change/presenting part out of pelvis Bedside ultra sound-breech with vtx in RUQ  FHT reacitive  A:breech P: recommend cesarean section     D/W patient and husband surgery, risks-infection, organ damage, bleeding/transfusion-HIV/Hep, DVT/PE, pneumonia.    All questions answered.

## 2012-02-15 NOTE — Transfer of Care (Signed)
Immediate Anesthesia Transfer of Care Note  Patient: Cristina Armstrong  Procedure(s) Performed: Procedure(s) (LRB): CESAREAN SECTION (N/A)  Patient Location: PACU  Anesthesia Type: Spinal  Level of Consciousness: awake  Airway & Oxygen Therapy: Patient Spontanous Breathing  Post-op Assessment: Report given to PACU RN and Post -op Vital signs reviewed and stable  Post vital signs: stable  Complications: No apparent anesthesia complications

## 2012-02-15 NOTE — Progress Notes (Signed)
Dr Henderson Cloud at bedside discussing risks and benefits of C/s with pt. Pt verbalizes understanding and has no further questions.

## 2012-02-15 NOTE — Op Note (Signed)
Cristina Armstrong, TROUTMAN NO.:  [id number]  MEDICAL RECORD NO.:  [id number]  LOCATION:  [facility]                          FACILITY:  [facility]  PHYSICIAN:  Guy Sandifer. Henderson Cloud, M.D. DATE OF BIRTH:  [birth year]  DATE OF PROCEDURE:  02/15/2012 DATE OF DISCHARGE:                              OPERATIVE REPORT   PREOPERATIVE DIAGNOSIS:  Breech.  POSTOPERATIVE DIAGNOSIS:  Breech.  PROCEDURE:  Repeat low transverse cesarean section.  SURGEON:  Guy Sandifer. Henderson Cloud, MD  ANESTHESIA:  Spinal, Cristina Quince, MD  ESTIMATED BLOOD LOSS:  500 mL.  FINDINGS:  Viable female infant, Apgars of 9 and 9 at 1 and 5 minutes respectively.  Arterial cord pH 7.32.  Birth weight pending.  SPECIMENS:  Placenta to pathology.  INDICATIONS AND CONSENT:  This patient is a 36 year old married white female, G2, P1 at 46 and 2/7th weeks.  She had previous cesarean section.  She is admitted to the hospital last evening for induction of labor secondary to mild gestational hypertension.  Blood pressures initially 150s/90s on admission.  Labs were normal.  Examination revealed the baby to be vertex, cervix to be too thick and -3.  The patient received low-dose Pitocin overnight and Pitocin per protocol today.  Examination this afternoon revealed the cervix to be unchanged, the presenting part to be out of reach.  Ultrasound confirmed a breech position with the baby's head in the right upper quadrant. Recommendation for cesarean section was made.  Potential risks and complications were discussed preoperatively including but not limited to, infection, organ damage, bleeding requiring transfusion of blood products with HIV and hepatitis acquisition, DVT, PE, pneumonia.  All questions were answered and consent is signed on the chart.  PROCEDURE:  The patient was taken to the operating room, where she was identified, spinal anesthetic was placed, and she was placed in a dorsal supine position with a 15 degree  left lateral wedge.  Time-out undertaken.  She was prepped.  Foley catheter was placed and the bladder was drained.  She was draped in sterile fashion.  After testing for adequate spinal anesthesia, skin was entered through a Pfannenstiel incision, removing the old scar on the way in.  Dissection was carried out in layers to the peritoneum which was incised and extended superiorly and inferiorly.  Vesicouterine peritoneum was taken down cephalad laterally.  The bladder flap was developed.  The bladder blade was placed.  Uterus was incised in a low transverse manner and the uterine cavity was entered bluntly with a hemostat.  The uterine incision was extended cephalad laterally with fingers.  Artificial rupture of membranes for clear fluid was carried out.  Baby was then delivered from the backup transverse position without difficulty.  Good cry and tone was noted.  Cord was clamped and cut and the baby was handed to awaiting pediatrics team.  Placenta was manually delivered and sent to pathology.  Uterine cavity was clean.  Uterus was closed in 2 running locking imbricating layers of 0 Monocryl suture which achieves good hemostasis.  Anterior peritoneum was closed in running fashion with 0 Monocryl suture which was also used to reapproximate the pyramidalis muscle in the midline.  Anterior  rectus fascia was closed in running fashion with a 0 looped PDS suture.  Subcutaneous tissues were reapproximated with plain suture and the skin was closed with clips. All sponge, instrument, needle counts were correct, and the patient was transferred to recovery room in stable condition.     Guy Sandifer Henderson Cloud, M.D.     JET/MEDQ  D:  02/15/2012  T:  02/15/2012  Job:  [id number]

## 2012-02-16 ENCOUNTER — Encounter (HOSPITAL_COMMUNITY): Payer: Self-pay | Admitting: Obstetrics and Gynecology

## 2012-02-16 LAB — CBC
MCH: 27.1 pg (ref 26.0–34.0)
MCHC: 32.5 g/dL (ref 30.0–36.0)
Platelets: 163 10*3/uL (ref 150–400)
RDW: 15.5 % (ref 11.5–15.5)

## 2012-02-16 LAB — RPR: RPR Ser Ql: NONREACTIVE

## 2012-02-16 MED ORDER — FERROUS SULFATE 325 (65 FE) MG PO TABS
325.0000 mg | ORAL_TABLET | Freq: Two times a day (BID) | ORAL | Status: DC
  Administered 2012-02-16 – 2012-02-17 (×3): 325 mg via ORAL
  Filled 2012-02-16 (×3): qty 1

## 2012-02-16 NOTE — Anesthesia Postprocedure Evaluation (Signed)
  Anesthesia Post-op Note  Patient: Cristina Armstrong  Procedure(s) Performed: Procedure(s) (LRB): CESAREAN SECTION (N/A)  Patient Location: Mother/Baby  Anesthesia Type: Spinal  Level of Consciousness: awake, alert  and oriented  Airway and Oxygen Therapy: Patient Spontanous Breathing  Post-op Pain: mild  Post-op Assessment: Patient's Cardiovascular Status Stable and Respiratory Function Stable  Post-op Vital Signs: stable  Complications: No apparent anesthesia complications

## 2012-02-16 NOTE — Addendum Note (Signed)
Addendum  created 02/16/12 1156 by Earmon Phoenix, CRNA   Modules edited:Notes Section

## 2012-02-16 NOTE — Progress Notes (Signed)
Subjective: Postpartum Day 1: Cesarean Delivery Patient reports tolerating PO and + flatus.    Objective: Vital signs in last 24 hours: Temp:  [92 F (33.3 C)-98.8 F (37.1 C)] 98.7 F (37.1 C) (06/09 0400) Pulse Rate:  [70-140] 90  (06/09 0400) Resp:  [16-20] 18  (06/09 0400) BP: (114-152)/(70-113) 114/70 mmHg (06/09 0400)  Physical Exam:  General: alert, cooperative and no distress Lochia: appropriate Uterine Fundus: firm Incision: healing well DVT Evaluation: No evidence of DVT seen on physical exam.   Basename 02/16/12 0614 02/14/12 2025  HGB 7.5* 9.4*  HCT 23.1* 27.7*    Assessment/Plan: Status post Cesarean section. Doing well postoperatively.  Continue current care.  Abdullah Rizzi II,Baby Stairs E 02/16/2012, 8:26 AM

## 2012-02-17 MED ORDER — DIBUCAINE 1 % RE OINT: 1.0000 "application " | TOPICAL_OINTMENT | RECTAL | Status: DC | PRN

## 2012-02-17 MED ORDER — IBUPROFEN 600 MG PO TABS: 600.0000 mg | ORAL_TABLET | Freq: Four times a day (QID) | ORAL | Status: DC

## 2012-02-17 MED ORDER — OXYCODONE-ACETAMINOPHEN 5-325 MG PO TABS: 1.0000 | ORAL_TABLET | ORAL | Status: AC | PRN

## 2012-02-17 MED ORDER — IBUPROFEN 600 MG PO TABS: 600.0000 mg | ORAL_TABLET | Freq: Four times a day (QID) | ORAL | Status: AC

## 2012-02-17 NOTE — Discharge Instructions (Signed)

## 2012-02-17 NOTE — Progress Notes (Signed)
Subjective: Postpartum Day 2: Cesarean Delivery Patient reports tolerating PO, + flatus and no problems voiding.    Objective: Vital signs in last 24 hours: Temp:  [97.5 F (36.4 C)-98.2 F (36.8 C)] 97.8 F (36.6 C) (06/10 0620) Pulse Rate:  [78-98] 98  (06/10 0620) Resp:  [20] 20  (06/10 0620) BP: (120-137)/(75-89) 137/89 mmHg (06/10 0620) SpO2:  [97 %-98 %] 98 % (06/09 1540)  Physical Exam:  General: alert, cooperative and no distress Lochia: appropriate Uterine Fundus: firm Incision: healing well DVT Evaluation: No evidence of DVT seen on physical exam.   Basename 02/16/12 0614 02/14/12 2025  HGB 7.5* 9.4*  HCT 23.1* 27.7*    Assessment/Plan: Status post Cesarean section. Doing well postoperatively.  Discharge home with standard precautions and return to clinic in 4-6 weeks Staples out later this week.  Klair Leising C 02/17/2012, 8:56 AM

## 2012-02-19 NOTE — Discharge Summary (Signed)
Obstetric Discharge Summary Reason for Admission: induction of labor and s/p previous cesarean Prenatal Procedures: ultrasound Intrapartum Procedures: cesarean: low cervical, transverse Postpartum Procedures: none Complications-Operative and Postpartum: none Hemoglobin  Date Value Range Status  02/16/2012 7.5* 12.0 - 15.0 g/dL Final     DELTA CHECK NOTED     REPEATED TO VERIFY     HCT  Date Value Range Status  02/16/2012 23.1* 36.0 - 46.0 % Final    Physical Exam:  General: alert and cooperative Lochia: appropriate Uterine Fundus: firm Incision: healing well DVT Evaluation: No evidence of DVT seen on physical exam.  Discharge Diagnoses: Term Pregnancy-delivered  Discharge Information: Date: 02/19/2012 Activity: pelvic rest Diet: routine Medications: PNV, Ibuprofen and Percocet Condition: stable Instructions: refer to practice specific booklet Discharge to: home Follow-up Information    Follow up with [facility],JAMES E, MD. Schedule an appointment as soon as possible for a visit in 6 weeks.   Contact information:   [address] [place] [address] [phone]          Newborn Data: Live born female  Birth Weight: 6 lb 15.6 oz (3165 g) APGAR: 9, 9  Home with mother.  Jerelle Virden G 02/19/2012, 8:20 AM

## 2012-02-20 ENCOUNTER — Inpatient Hospital Stay (HOSPITAL_COMMUNITY): Admission: AD | Admit: 2012-02-20 | Payer: Self-pay | Source: Ambulatory Visit | Admitting: Obstetrics and Gynecology

## 2013-08-25 LAB — OB RESULTS CONSOLE HEPATITIS B SURFACE ANTIGEN: Hepatitis B Surface Ag: NEGATIVE

## 2013-08-25 LAB — OB RESULTS CONSOLE HIV ANTIBODY (ROUTINE TESTING): HIV: NONREACTIVE

## 2013-08-25 LAB — OB RESULTS CONSOLE ABO/RH: RH Type: POSITIVE

## 2013-08-25 LAB — OB RESULTS CONSOLE ANTIBODY SCREEN: ANTIBODY SCREEN: NEGATIVE

## 2013-08-25 LAB — OB RESULTS CONSOLE RUBELLA ANTIBODY, IGM: Rubella: IMMUNE

## 2013-08-25 LAB — OB RESULTS CONSOLE RPR: RPR: NONREACTIVE

## 2014-03-15 ENCOUNTER — Encounter (HOSPITAL_COMMUNITY): Payer: Self-pay | Admitting: Pharmacist

## 2014-03-16 NOTE — H&P (Signed)
Cristina Armstrong  DICTATION # [id number] CSN# [id number]   Meriel PicaHOLLAND,Cristina Schrag M, MD 03/16/2014 9:35 AM

## 2014-03-17 LAB — OB RESULTS CONSOLE GBS: STREP GROUP B AG: POSITIVE

## 2014-03-18 NOTE — H&P (Signed)
NAMDurward Armstrong:  Covalt, Marabeth               ACCOUNT NO.:  [id number]  MEDICAL RECORD NO.:  [id number]  LOCATION:                                 FACILITY:  PHYSICIAN:  Duke Salviaichard M. Marcelle OverlieHolland, M.D.DATE OF BIRTH:  03/08/1976  DATE OF ADMISSION: DATE OF DISCHARGE:                             HISTORY & PHYSICAL   CHIEF COMPLAINT:  Repeat cesarean section at term.  HISTORY OF PRESENT ILLNESS:  A 38 year old 143, P2-0-0-2, EDD July 26 patient.  This patient has had 2 previous cesarean section, presents now for repeat cesarean section at term.  Her early pregnancy screening with panorama and AFP returned normal.  One hour GTT was normal at 126.  The procedure of repeat cesarean section including specific risks related to bleeding, infection, transfusion, adjacent organ injury, wound infection, phlebitis along with her expected postop recovery time discussed with her which she understands and accepts.  She has been on prenatal vitamins plus supplemental iron.  Blood type is A positive.  Cystic fibrosis screen was negative.  PAST MEDICAL HISTORY:  Please see the [facility] form for details.  PHYSICAL EXAMINATION:  VITAL SIGNS:  Temp 98.2, blood pressure 120/78. HEENT:  Unremarkable. NECK:  Supple without masses. LUNGS:  Clear. CARDIOVASCULAR:  Regular rate and rhythm without murmurs, rubs, gallops. BREASTS:  Without masses. PELVIC:  Term fundal height.  Fetal heart rate 140, cervix was closed. EXTREMITIES:  Unremarkable. NEUROLOGIC:  Unremarkable.  IMPRESSION:  Term pregnancy, for repeat cesarean section.  Procedure and risks discussed as above.     Lilybelle Mayeda M. Marcelle OverlieHolland, M.D.     RMH/MEDQ  D:  03/16/2014  T:  03/16/2014  Job:  [id number]

## 2014-03-20 ENCOUNTER — Inpatient Hospital Stay (HOSPITAL_COMMUNITY)
Admission: AD | Admit: 2014-03-20 | Discharge: 2014-03-20 | Disposition: A | Discharge status: Home / Self Care | Payer: Commercial Managed Care - PPO | Source: Ambulatory Visit | Attending: Obstetrics and Gynecology | Admitting: Obstetrics and Gynecology

## 2014-03-20 ENCOUNTER — Encounter (HOSPITAL_COMMUNITY): Payer: Self-pay | Admitting: *Deleted

## 2014-03-20 DIAGNOSIS — O10019 Pre-existing essential hypertension complicating pregnancy, unspecified trimester: Secondary | ICD-10-CM | POA: Insufficient documentation

## 2014-03-20 DIAGNOSIS — R1110 Vomiting, unspecified: Secondary | ICD-10-CM

## 2014-03-20 DIAGNOSIS — O163 Unspecified maternal hypertension, third trimester: Secondary | ICD-10-CM

## 2014-03-20 DIAGNOSIS — O479 False labor, unspecified: Secondary | ICD-10-CM | POA: Insufficient documentation

## 2014-03-20 DIAGNOSIS — O212 Late vomiting of pregnancy: Secondary | ICD-10-CM | POA: Insufficient documentation

## 2014-03-20 DIAGNOSIS — R197 Diarrhea, unspecified: Secondary | ICD-10-CM

## 2014-03-20 LAB — CBC
HCT: 32.5 % — ABNORMAL LOW (ref 36.0–46.0)
Hemoglobin: 10.9 g/dL — ABNORMAL LOW (ref 12.0–15.0)
MCH: 27.9 pg (ref 26.0–34.0)
MCHC: 33.5 g/dL (ref 30.0–36.0)
MCV: 83.3 fL (ref 78.0–100.0)
Platelets: 229 10*3/uL (ref 150–400)
RBC: 3.9 MIL/uL (ref 3.87–5.11)
RDW: 16.9 % — ABNORMAL HIGH (ref 11.5–15.5)
WBC: 11.5 10*3/uL — ABNORMAL HIGH (ref 4.0–10.5)

## 2014-03-20 LAB — COMPREHENSIVE METABOLIC PANEL
ALT: 7 U/L (ref 0–35)
AST: 11 U/L (ref 0–37)
Albumin: 2.7 g/dL — ABNORMAL LOW (ref 3.5–5.2)
Alkaline Phosphatase: 123 U/L — ABNORMAL HIGH (ref 39–117)
Anion gap: 17 — ABNORMAL HIGH (ref 5–15)
BUN: 13 mg/dL (ref 6–23)
CO2: 20 mEq/L (ref 19–32)
Calcium: 10 mg/dL (ref 8.4–10.5)
Chloride: 99 mEq/L (ref 96–112)
Creatinine, Ser: 0.78 mg/dL (ref 0.50–1.10)
GFR calc Af Amer: >90 mL/min (ref >90)
GFR calc non Af Amer: >90 mL/min (ref >90)
Glucose, Bld: 76 mg/dL (ref 70–99)
Potassium: 4.1 mEq/L (ref 3.7–5.3)
Sodium: 136 mEq/L — ABNORMAL LOW (ref 137–147)
Total Bilirubin: 0.2 mg/dL — ABNORMAL LOW (ref 0.3–1.2)
Total Protein: 6.9 g/dL (ref 6.0–8.3)

## 2014-03-20 LAB — URINE MICROSCOPIC-ADD ON

## 2014-03-20 LAB — URINALYSIS, ROUTINE W REFLEX MICROSCOPIC
GLUCOSE, UA: NEGATIVE mg/dL
Ketones, ur: NEGATIVE mg/dL
NITRITE: NEGATIVE
PH: 6 (ref 5.0–8.0)
Protein, ur: 30 mg/dL — AB
SPECIFIC GRAVITY, URINE: 1.02 (ref 1.005–1.030)
Urobilinogen, UA: 0.2 mg/dL (ref 0.0–1.0)

## 2014-03-20 LAB — PROTEIN / CREATININE RATIO, URINE
Creatinine, Urine: 234.79 mg/dL
Protein Creatinine Ratio: 0.14 (ref 0.00–0.15)
Total Protein, Urine: 33.1 mg/dL

## 2014-03-20 LAB — LACTATE DEHYDROGENASE: LDH: 140 U/L (ref 94–250)

## 2014-03-20 LAB — URIC ACID: Uric Acid, Serum: 8.4 mg/dL — ABNORMAL HIGH (ref 2.4–7.0)

## 2014-03-20 MED ORDER — PROMETHAZINE HCL 25 MG PO TABS: 25.0000 mg | ORAL_TABLET | Freq: Four times a day (QID) | ORAL | Status: DC | PRN

## 2014-03-20 NOTE — MAU Provider Note (Signed)
History     CSN: [id number]  Arrival date and time: 03/20/14 1147   First Provider Initiated Contact with Patient 03/20/14 1306      Chief Complaint  Patient presents with  . Emesis  . Diarrhea  . Hypertension   HPIam isa 38 y.o. G3P2002 at 38 wks. She started having diarrhea 7/10 evening, has continued until 7 this am. Started vomiting 7/11, none today. Her B/P at home this am was 160/110. She has sl headache, no epigastric pain or swelling. Baby is not as active as usual, but moving. Has BH contractions, no leaking or bleeding. Has repeat C/S scheduled 7/20.  OB History   Grav Para Term Preterm Abortions TAB SAB Ect Mult Living   3 2 2  0 0 0 0 0 0 2      Past Medical History  Diagnosis Date  . Abnormal Pap smear   . History of chicken pox   . Hx of osteopenia   . Anemia   . Celiac disease     Past Surgical History  Procedure Laterality Date  . Cesarean section    . Colposcopy    . Cesarean section  02/15/2012    Procedure: CESAREAN SECTION;  Surgeon: Leslie Andrea, MD;  Location: [facility];  Service: Gynecology;  Laterality: N/A;    Family History  Problem Relation Age of Onset  . Cancer Mother     metastatic ovarian  . Parkinsonism Father   . Heart disease Maternal Grandmother   . Stroke Maternal Grandmother   . Cancer Maternal Grandfather     lung  . Cancer Paternal Grandmother     ovarian    History  Substance Use Topics  . Smoking status: Never Smoker   . Smokeless tobacco: Never Used  . Alcohol Use: No    Allergies:  Allergies  Allergen Reactions  . Gluten Meal     Celiac disease    Prescriptions prior to admission  Medication Sig Dispense Refill  . ferrous sulfate 325 (65 FE) MG tablet Take 325 mg by mouth 2 (two) times daily with a meal.      . Prenatal Vit-Fe Fumarate-FA (PRENATAL MULTIVITAMIN) TABS Take 1 tablet by mouth daily.        Review of Systems  Eyes: Negative for blurred vision.  Cardiovascular: Negative for leg swelling.   Gastrointestinal: Positive for vomiting and diarrhea. Negative for abdominal pain.  Neurological: Positive for headaches.   Physical Exam   Blood pressure 134/99, pulse 94, temperature 98.4 F (36.9 C), temperature source Oral, resp. rate 16, weight 223 lb (101.152 kg), unknown if currently breastfeeding.  Physical Exam  Nursing note and vitals reviewed. Constitutional: She is oriented to person, place, and time. She appears well-developed and well-nourished. No distress.  GI: Soft. There is no tenderness.  Musculoskeletal: Normal range of motion.  Neurological: She is alert and oriented to person, place, and time. She has normal reflexes.  Skin: Skin is warm and dry.  Psychiatric: She has a normal mood and affect. Her behavior is normal.    MAU Course  Procedures  MDM Results for orders placed during the hospital encounter of 03/20/14 (from the past 24 hour(s))  URINALYSIS, ROUTINE W REFLEX MICROSCOPIC     Status: Abnormal   Collection Time    03/20/14 12:25 PM      Result Value Ref Range   Color, Urine YELLOW  YELLOW   APPearance CLEAR  CLEAR   Specific Gravity, Urine 1.020  1.005 -  1.030   pH 6.0  5.0 - 8.0   Glucose, UA NEGATIVE  NEGATIVE mg/dL   Hgb urine dipstick SMALL (*) NEGATIVE   Bilirubin Urine SMALL (*) NEGATIVE   Ketones, ur NEGATIVE  NEGATIVE mg/dL   Protein, ur 30 (*) NEGATIVE mg/dL   Urobilinogen, UA 0.2  0.0 - 1.0 mg/dL   Nitrite NEGATIVE  NEGATIVE   Leukocytes, UA SMALL (*) NEGATIVE  URINE MICROSCOPIC-ADD ON     Status: Abnormal   Collection Time    03/20/14 12:25 PM      Result Value Ref Range   Squamous Epithelial / LPF FEW (*) RARE   WBC, UA 3-6  <3 WBC/hpf   RBC / HPF 3-6  <3 RBC/hpf   Bacteria, UA FEW (*) RARE   Urine-Other MUCOUS PRESENT    PROTEIN / CREATININE RATIO, URINE     Status: None   Collection Time    03/20/14 12:25 PM      Result Value Ref Range   Creatinine, Urine 234.79     Total Protein, Urine 33.1     PROTEIN CREATININE  RATIO 0.14  0.00 - 0.15  CBC     Status: Abnormal   Collection Time    03/20/14  1:43 PM      Result Value Ref Range   WBC 11.5 (*) 4.0 - 10.5 K/uL   RBC 3.90  3.87 - 5.11 MIL/uL   Hemoglobin 10.9 (*) 12.0 - 15.0 g/dL   HCT 96.0 (*) 45.4 - 09.8 %   MCV 83.3  78.0 - 100.0 fL   MCH 27.9  26.0 - 34.0 pg   MCHC 33.5  30.0 - 36.0 g/dL   RDW 11.9 (*) 14.7 - 82.9 %   Platelets 229  150 - 400 K/uL  COMPREHENSIVE METABOLIC PANEL     Status: Abnormal   Collection Time    03/20/14  1:43 PM      Result Value Ref Range   Sodium 136 (*) 137 - 147 mEq/L   Potassium 4.1  3.7 - 5.3 mEq/L   Chloride 99  96 - 112 mEq/L   CO2 20  19 - 32 mEq/L   Glucose, Bld 76  70 - 99 mg/dL   BUN 13  6 - 23 mg/dL   Creatinine, Ser 5.62  0.50 - 1.10 mg/dL   Calcium 13.0  8.4 - 86.5 mg/dL   Total Protein 6.9  6.0 - 8.3 g/dL   Albumin 2.7 (*) 3.5 - 5.2 g/dL   AST 11  0 - 37 U/L   ALT 7  0 - 35 U/L   Alkaline Phosphatase 123 (*) 39 - 117 U/L   Total Bilirubin 0.2 (*) 0.3 - 1.2 mg/dL   GFR calc non Af Amer >90  >90 mL/min   GFR calc Af Amer >90  >90 mL/min   Anion gap 17 (*) 5 - 15  URIC ACID     Status: Abnormal   Collection Time    03/20/14  1:43 PM      Result Value Ref Range   Uric Acid, Serum 8.4 (*) 2.4 - 7.0 mg/dL  LACTATE DEHYDROGENASE     Status: None   Collection Time    03/20/14  1:43 PM      Result Value Ref Range   LDH 140  94 - 250 U/L     Assessment and Plan  38 wks  Vomiting and diarrhea x 36 hr B/P elevated, PIH labs nl Reactive strip, mild  contractions Consulted with Dr Renaldo FiddlerAdkins- may give med for vomiting, discharge home. Come back if S&S  return, OV tomorrow for B/P check  Cristina Armstrong, Cristina GullyKATHY M. 03/20/2014, 1:14 PM

## 2014-03-20 NOTE — MAU Note (Signed)
Fr night, diarrhea started. Sat, more diarrhea and couldn't keep anything down. 150-160/100-110, had been elevated on Thurs in office, not that high.

## 2014-03-21 NOTE — Patient Instructions (Signed)
   Your procedure is scheduled on:  Monday, July 20  [facility] at: 6 Am Pick up the phone at the desk and dial [phone] and inform us of your arrival.  Please call this number if you have any problems the morning of surgery: [phone]  Remember: Do not eat or drink after midnight: Sunday Take these medicines the morning of surgery with a SIP OF WATER:  Do not wear jewelry, make-up, or FINGER nail polish No metal in your hair or on your body. Do not wear lotions, powders, perfumes.  You may wear deodorant.  Do not bring valuables to the hospital. Contacts, dentures or bridgework may not be worn into surgery.  Leave suitcase in the car. After Surgery it may be brought to your room. For patients being admitted to the hospital, checkout time is 11:00am the day of discharge.

## 2014-03-22 ENCOUNTER — Inpatient Hospital Stay (HOSPITAL_COMMUNITY)
Admission: RE | Admit: 2014-03-22 | Discharge: 2014-03-22 | Disposition: A | Discharge status: Home / Self Care | Payer: Commercial Managed Care - PPO | Source: Ambulatory Visit

## 2014-03-24 ENCOUNTER — Encounter (HOSPITAL_COMMUNITY): Payer: Self-pay

## 2014-03-24 ENCOUNTER — Encounter (HOSPITAL_COMMUNITY)
Admission: RE | Admit: 2014-03-24 | Discharge: 2014-03-24 | Disposition: A | Discharge status: Home / Self Care | Payer: Commercial Managed Care - PPO | Source: Ambulatory Visit | Attending: Obstetrics and Gynecology | Admitting: Obstetrics and Gynecology

## 2014-03-24 ENCOUNTER — Encounter (HOSPITAL_COMMUNITY): Payer: Self-pay | Admitting: *Deleted

## 2014-03-24 DIAGNOSIS — Z01812 Encounter for preprocedural laboratory examination: Secondary | ICD-10-CM | POA: Insufficient documentation

## 2014-03-24 HISTORY — DX: Other specified postprocedural states: Z98.890

## 2014-03-24 HISTORY — DX: Nausea with vomiting, unspecified: R11.2

## 2014-03-24 LAB — TYPE AND SCREEN
ABO/RH(D): A POS
ANTIBODY SCREEN: NEGATIVE

## 2014-03-24 LAB — CBC
HCT: 30.5 % — ABNORMAL LOW (ref 36.0–46.0)
HEMOGLOBIN: 10.3 g/dL — AB (ref 12.0–15.0)
MCH: 28.1 pg (ref 26.0–34.0)
MCHC: 33.8 g/dL (ref 30.0–36.0)
MCV: 83.3 fL (ref 78.0–100.0)
PLATELETS: 216 10*3/uL (ref 150–400)
RBC: 3.66 MIL/uL — AB (ref 3.87–5.11)
RDW: 16.9 % — ABNORMAL HIGH (ref 11.5–15.5)
WBC: 9.6 10*3/uL (ref 4.0–10.5)

## 2014-03-24 NOTE — Pre-Procedure Instructions (Signed)
Patient would like the same anesthesia to be used as her C/S in 02/2012..Marland Kitchen

## 2014-03-24 NOTE — Patient Instructions (Signed)
   Your procedure is scheduled on: Monday, July 20  [facility] at:  6 AM Pick up the phone at the desk and dial [phone] and inform us of your arrival.  Please call this number if you have any problems the morning of surgery: [phone]  Remember: Do not eat or drink after midnight: Sunday Take these medicines the morning of surgery with a SIP OF WATER:  None  Do not wear jewelry, make-up, or FINGER nail polish No metal in your hair or on your body. Do not wear lotions, powders, perfumes.  You may wear deodorant.  Do not bring valuables to the hospital. Contacts, dentures or bridgework may not be worn into surgery.  Leave suitcase in the car. After Surgery it may be brought to your room. For patients being admitted to the hospital, checkout time is 11:00am the day of discharge.  [facility] [phone].

## 2014-03-25 LAB — RPR

## 2014-03-27 MED ORDER — DEXTROSE 5 % IV SOLN
2.0000 g | INTRAVENOUS | Status: AC
  Administered 2014-03-28: 2 g via INTRAVENOUS
  Filled 2014-03-27: qty 2

## 2014-03-27 NOTE — Anesthesia Preprocedure Evaluation (Signed)
Anesthesia Evaluation  Patient identified by MRN, date of birth, ID band Patient awake    Reviewed: Allergy & Precautions, H&P , NPO status , Patient's Chart, lab work & pertinent test results  Airway Mallampati: II TM Distance: >3 FB Neck ROM: full    Dental no notable dental hx.    Pulmonary neg pulmonary ROS,    Pulmonary exam normal       Cardiovascular negative cardio ROS      Neuro/Psych negative neurological ROS  negative psych ROS   GI/Hepatic negative GI ROS, Neg liver ROS,   Endo/Other  Morbid obesity  Renal/GU negative Renal ROS  negative genitourinary   Musculoskeletal negative musculoskeletal ROS (+)   Abdominal Normal abdominal exam  (+) + obese,   Peds negative pediatric ROS (+)  Hematology negative hematology ROS (+)   Anesthesia Other Findings   Reproductive/Obstetrics (+) Pregnancy                           Anesthesia Physical  Anesthesia Plan  ASA: III  Anesthesia Plan: Spinal   Post-op Pain Management:    Induction:   Airway Management Planned:   Additional Equipment:   Intra-op Plan:   Post-operative Plan:   Informed Consent: I have reviewed the patients History and Physical, chart, labs and discussed the procedure including the risks, benefits and alternatives for the proposed anesthesia with the patient or authorized representative who has indicated his/her understanding and acceptance.     Plan Discussed with: CRNA  Anesthesia Plan Comments:         Anesthesia Quick Evaluation

## 2014-03-28 ENCOUNTER — Inpatient Hospital Stay (HOSPITAL_COMMUNITY)
Admission: RE | Admit: 2014-03-28 | Discharge: 2014-03-30 | DRG: 765 | Disposition: A | Discharge status: Home / Self Care | Payer: Commercial Managed Care - PPO | Source: Ambulatory Visit | Attending: Obstetrics and Gynecology | Admitting: Obstetrics and Gynecology

## 2014-03-28 ENCOUNTER — Encounter (HOSPITAL_COMMUNITY): Payer: Self-pay | Admitting: Anesthesiology

## 2014-03-28 ENCOUNTER — Encounter (HOSPITAL_COMMUNITY)
Admission: RE | Disposition: A | Discharge status: Home / Self Care | Payer: Self-pay | Source: Ambulatory Visit | Attending: Obstetrics and Gynecology

## 2014-03-28 ENCOUNTER — Inpatient Hospital Stay (HOSPITAL_COMMUNITY): Payer: Commercial Managed Care - PPO | Admitting: Anesthesiology

## 2014-03-28 DIAGNOSIS — O99214 Obesity complicating childbirth: Secondary | ICD-10-CM

## 2014-03-28 DIAGNOSIS — D649 Anemia, unspecified: Secondary | ICD-10-CM | POA: Diagnosis present

## 2014-03-28 DIAGNOSIS — Z98891 History of uterine scar from previous surgery: Secondary | ICD-10-CM

## 2014-03-28 DIAGNOSIS — Z3490 Encounter for supervision of normal pregnancy, unspecified, unspecified trimester: Secondary | ICD-10-CM

## 2014-03-28 DIAGNOSIS — O09529 Supervision of elderly multigravida, unspecified trimester: Secondary | ICD-10-CM | POA: Diagnosis present

## 2014-03-28 DIAGNOSIS — E669 Obesity, unspecified: Secondary | ICD-10-CM | POA: Diagnosis present

## 2014-03-28 DIAGNOSIS — Z6841 Body Mass Index (BMI) 40.0 and over, adult: Secondary | ICD-10-CM

## 2014-03-28 DIAGNOSIS — O9903 Anemia complicating the puerperium: Secondary | ICD-10-CM | POA: Diagnosis present

## 2014-03-28 DIAGNOSIS — O34219 Maternal care for unspecified type scar from previous cesarean delivery: Principal | ICD-10-CM | POA: Diagnosis present

## 2014-03-28 LAB — CBC
HCT: 24.5 % — ABNORMAL LOW (ref 36.0–46.0)
HEMATOCRIT: 24 % — AB (ref 36.0–46.0)
Hemoglobin: 8.1 g/dL — ABNORMAL LOW (ref 12.0–15.0)
Hemoglobin: 8.1 g/dL — ABNORMAL LOW (ref 12.0–15.0)
MCH: 27.4 pg (ref 26.0–34.0)
MCH: 27.7 pg (ref 26.0–34.0)
MCHC: 33.1 g/dL (ref 30.0–36.0)
MCHC: 33.8 g/dL (ref 30.0–36.0)
MCV: 82.2 fL (ref 78.0–100.0)
MCV: 82.8 fL (ref 78.0–100.0)
Platelets: 186 10*3/uL (ref 150–400)
Platelets: 200 10*3/uL (ref 150–400)
RBC: 2.96 MIL/uL — ABNORMAL LOW (ref 3.87–5.11)
RBC: 2.92 MIL/uL — ABNORMAL LOW (ref 3.87–5.11)
RDW: 16.6 % — AB (ref 11.5–15.5)
RDW: 16.7 % — AB (ref 11.5–15.5)
WBC: 15.5 10*3/uL — ABNORMAL HIGH (ref 4.0–10.5)
WBC: 15.4 10*3/uL — ABNORMAL HIGH (ref 4.0–10.5)

## 2014-03-28 LAB — GLUCOSE, CAPILLARY: Glucose-Capillary: 89 mg/dL (ref 70–99)

## 2014-03-28 LAB — COMPREHENSIVE METABOLIC PANEL
ALBUMIN: 1.9 g/dL — AB (ref 3.5–5.2)
ALK PHOS: 94 U/L (ref 39–117)
ALT: 7 U/L (ref 0–35)
AST: 12 U/L (ref 0–37)
Anion gap: 13 (ref 5–15)
BUN: 14 mg/dL (ref 6–23)
CALCIUM: 8.6 mg/dL (ref 8.4–10.5)
CO2: 21 mEq/L (ref 19–32)
Chloride: 100 mEq/L (ref 96–112)
Creatinine, Ser: 0.88 mg/dL (ref 0.50–1.10)
GFR calc Af Amer: >90 mL/min (ref >90)
GFR calc non Af Amer: 84 mL/min — ABNORMAL LOW (ref >90)
Glucose, Bld: 89 mg/dL (ref 70–99)
POTASSIUM: 4.2 meq/L (ref 3.7–5.3)
SODIUM: 134 meq/L — AB (ref 137–147)
Total Bilirubin: <0.2 mg/dL — ABNORMAL LOW (ref 0.3–1.2)
Total Protein: 5.3 g/dL — ABNORMAL LOW (ref 6.0–8.3)

## 2014-03-28 LAB — PREPARE RBC (CROSSMATCH)

## 2014-03-28 LAB — DIC (DISSEMINATED INTRAVASCULAR COAGULATION) PANEL
APTT: 22 s — AB (ref 24–37)
FIBRINOGEN: 463 mg/dL (ref 204–475)
INR: 0.99 (ref 0.00–1.49)
SMEAR REVIEW: NONE SEEN

## 2014-03-28 LAB — DIC (DISSEMINATED INTRAVASCULAR COAGULATION)PANEL
D-Dimer, Quant: 5.9 ug/mL-FEU — ABNORMAL HIGH (ref 0.00–0.48)
Platelets: 191 10*3/uL (ref 150–400)
Prothrombin Time: 13.1 seconds (ref 11.6–15.2)

## 2014-03-28 SURGERY — Surgical Case
Anesthesia: Spinal | Site: Abdomen

## 2014-03-28 MED ORDER — LACTATED RINGERS IV SOLN
INTRAVENOUS | Status: DC
  Administered 2014-03-28 – 2014-03-29 (×2): via INTRAVENOUS

## 2014-03-28 MED ORDER — ONDANSETRON HCL 4 MG/2ML IJ SOLN
INTRAMUSCULAR | Status: AC
  Filled 2014-03-28: qty 2

## 2014-03-28 MED ORDER — BISACODYL 10 MG RE SUPP: 10.0000 mg | Freq: Every day | RECTAL | Status: DC | PRN

## 2014-03-28 MED ORDER — ONDANSETRON HCL 4 MG PO TABS: 4.0000 mg | ORAL_TABLET | ORAL | Status: DC | PRN

## 2014-03-28 MED ORDER — NALOXONE HCL 0.4 MG/ML IJ SOLN: 0.4000 mg | INTRAMUSCULAR | Status: DC | PRN

## 2014-03-28 MED ORDER — DIPHENHYDRAMINE HCL 50 MG/ML IJ SOLN: 25.0000 mg | INTRAMUSCULAR | Status: DC | PRN

## 2014-03-28 MED ORDER — SENNOSIDES-DOCUSATE SODIUM 8.6-50 MG PO TABS
2.0000 | ORAL_TABLET | ORAL | Status: DC
  Administered 2014-03-30: 2 via ORAL
  Filled 2014-03-28: qty 2

## 2014-03-28 MED ORDER — SODIUM CHLORIDE 0.9 % IJ SOLN: 3.0000 mL | Freq: Two times a day (BID) | INTRAMUSCULAR | Status: DC

## 2014-03-28 MED ORDER — FLEET ENEMA 7-19 GM/118ML RE ENEM: 1.0000 | ENEMA | Freq: Every day | RECTAL | Status: DC | PRN

## 2014-03-28 MED ORDER — DIPHENHYDRAMINE HCL 25 MG PO CAPS: 25.0000 mg | ORAL_CAPSULE | ORAL | Status: DC | PRN

## 2014-03-28 MED ORDER — SIMETHICONE 80 MG PO CHEW
80.0000 mg | CHEWABLE_TABLET | ORAL | Status: DC
  Administered 2014-03-30: 80 mg via ORAL
  Filled 2014-03-28: qty 1

## 2014-03-28 MED ORDER — LANOLIN HYDROUS EX OINT: 1.0000 "application " | TOPICAL_OINTMENT | CUTANEOUS | Status: DC | PRN

## 2014-03-28 MED ORDER — ZOLPIDEM TARTRATE 5 MG PO TABS: 5.0000 mg | ORAL_TABLET | Freq: Every evening | ORAL | Status: DC | PRN

## 2014-03-28 MED ORDER — EPHEDRINE 5 MG/ML INJ
INTRAVENOUS | Status: AC
  Filled 2014-03-28: qty 10

## 2014-03-28 MED ORDER — SODIUM CHLORIDE 0.9 % IJ SOLN: 3.0000 mL | INTRAMUSCULAR | Status: DC | PRN

## 2014-03-28 MED ORDER — ONDANSETRON HCL 4 MG/2ML IJ SOLN: 4.0000 mg | INTRAMUSCULAR | Status: DC | PRN

## 2014-03-28 MED ORDER — MEASLES, MUMPS & RUBELLA VAC ~~LOC~~ INJ
0.5000 mL | INJECTION | Freq: Once | SUBCUTANEOUS | Status: DC
  Filled 2014-03-28: qty 0.5

## 2014-03-28 MED ORDER — KETOROLAC TROMETHAMINE 30 MG/ML IJ SOLN: 30.0000 mg | Freq: Four times a day (QID) | INTRAMUSCULAR | Status: AC | PRN

## 2014-03-28 MED ORDER — OXYTOCIN 40 UNITS IN LACTATED RINGERS INFUSION - SIMPLE MED: 62.5000 mL/h | INTRAVENOUS | Status: AC

## 2014-03-28 MED ORDER — ONDANSETRON HCL 4 MG/2ML IJ SOLN
INTRAMUSCULAR | Status: DC | PRN
  Administered 2014-03-28: 4 mg via INTRAVENOUS

## 2014-03-28 MED ORDER — SCOPOLAMINE 1 MG/3DAYS TD PT72: 1.0000 | MEDICATED_PATCH | Freq: Once | TRANSDERMAL | Status: DC

## 2014-03-28 MED ORDER — DIBUCAINE 1 % RE OINT: 1.0000 "application " | TOPICAL_OINTMENT | RECTAL | Status: DC | PRN

## 2014-03-28 MED ORDER — TETANUS-DIPHTH-ACELL PERTUSSIS 5-2.5-18.5 LF-MCG/0.5 IM SUSP: 0.5000 mL | Freq: Once | INTRAMUSCULAR | Status: DC

## 2014-03-28 MED ORDER — MORPHINE SULFATE (PF) 0.5 MG/ML IJ SOLN
INTRAMUSCULAR | Status: DC | PRN
  Administered 2014-03-28: .15 mg via INTRATHECAL

## 2014-03-28 MED ORDER — SCOPOLAMINE 1 MG/3DAYS TD PT72
MEDICATED_PATCH | TRANSDERMAL | Status: AC
  Administered 2014-03-28: 1.5 mg
  Filled 2014-03-28: qty 1

## 2014-03-28 MED ORDER — LACTATED RINGERS IV SOLN
INTRAVENOUS | Status: DC
  Administered 2014-03-28: 07:00:00 via INTRAVENOUS

## 2014-03-28 MED ORDER — NALBUPHINE HCL 10 MG/ML IJ SOLN: 5.0000 mg | INTRAMUSCULAR | Status: DC | PRN

## 2014-03-28 MED ORDER — LACTATED RINGERS IV BOLUS (SEPSIS)
1000.0000 mL | Freq: Once | INTRAVENOUS | Status: AC
  Administered 2014-03-28: 1000 mL via INTRAVENOUS

## 2014-03-28 MED ORDER — DEXTROSE 5 % IV SOLN: 1.0000 ug/kg/h | INTRAVENOUS | Status: DC | PRN

## 2014-03-28 MED ORDER — OXYTOCIN 10 UNIT/ML IJ SOLN
INTRAMUSCULAR | Status: AC
  Filled 2014-03-28: qty 4

## 2014-03-28 MED ORDER — METOCLOPRAMIDE HCL 5 MG/ML IJ SOLN: 10.0000 mg | Freq: Three times a day (TID) | INTRAMUSCULAR | Status: DC | PRN

## 2014-03-28 MED ORDER — KETOROLAC TROMETHAMINE 30 MG/ML IJ SOLN
INTRAMUSCULAR | Status: AC
  Filled 2014-03-28: qty 1

## 2014-03-28 MED ORDER — SIMETHICONE 80 MG PO CHEW
80.0000 mg | CHEWABLE_TABLET | Freq: Three times a day (TID) | ORAL | Status: DC
  Administered 2014-03-28 – 2014-03-30 (×5): 80 mg via ORAL
  Filled 2014-03-28 (×5): qty 1

## 2014-03-28 MED ORDER — SODIUM CHLORIDE 0.9 % IV SOLN: 250.0000 mL | INTRAVENOUS | Status: DC

## 2014-03-28 MED ORDER — PHENYLEPHRINE 8 MG IN D5W 100 ML (0.08MG/ML) PREMIX OPTIME
INJECTION | INTRAVENOUS | Status: DC | PRN
  Administered 2014-03-28: 40 ug/min via INTRAVENOUS

## 2014-03-28 MED ORDER — PRENATAL MULTIVITAMIN CH
1.0000 | ORAL_TABLET | Freq: Every day | ORAL | Status: DC
  Administered 2014-03-29: 1 via ORAL
  Filled 2014-03-28: qty 1

## 2014-03-28 MED ORDER — OXYTOCIN 10 UNIT/ML IJ SOLN
INTRAMUSCULAR | Status: DC | PRN
  Administered 2014-03-28: 40 [IU] via INTRAMUSCULAR

## 2014-03-28 MED ORDER — MEPERIDINE HCL 25 MG/ML IJ SOLN: 6.2500 mg | INTRAMUSCULAR | Status: DC | PRN

## 2014-03-28 MED ORDER — KETOROLAC TROMETHAMINE 30 MG/ML IJ SOLN
30.0000 mg | Freq: Four times a day (QID) | INTRAMUSCULAR | Status: AC | PRN
  Administered 2014-03-28: 30 mg via INTRAMUSCULAR

## 2014-03-28 MED ORDER — FENTANYL CITRATE 0.05 MG/ML IJ SOLN
INTRAMUSCULAR | Status: DC | PRN
  Administered 2014-03-28: 25 ug via INTRATHECAL

## 2014-03-28 MED ORDER — DIPHENHYDRAMINE HCL 50 MG/ML IJ SOLN: 12.5000 mg | INTRAMUSCULAR | Status: DC | PRN

## 2014-03-28 MED ORDER — LACTATED RINGERS IV SOLN
INTRAVENOUS | Status: DC | PRN
  Administered 2014-03-28 (×4): via INTRAVENOUS

## 2014-03-28 MED ORDER — FENTANYL CITRATE 0.05 MG/ML IJ SOLN
INTRAMUSCULAR | Status: AC
  Filled 2014-03-28: qty 2

## 2014-03-28 MED ORDER — WITCH HAZEL-GLYCERIN EX PADS: 1.0000 "application " | MEDICATED_PAD | CUTANEOUS | Status: DC | PRN

## 2014-03-28 MED ORDER — IBUPROFEN 800 MG PO TABS
800.0000 mg | ORAL_TABLET | Freq: Three times a day (TID) | ORAL | Status: DC | PRN
Start: 2014-03-28 — End: 2014-03-30
  Administered 2014-03-28 – 2014-03-30 (×6): 800 mg via ORAL
  Filled 2014-03-28 (×6): qty 1

## 2014-03-28 MED ORDER — FENTANYL CITRATE 0.05 MG/ML IJ SOLN: 25.0000 ug | INTRAMUSCULAR | Status: DC | PRN

## 2014-03-28 MED ORDER — DIPHENHYDRAMINE HCL 25 MG PO CAPS: 25.0000 mg | ORAL_CAPSULE | Freq: Four times a day (QID) | ORAL | Status: DC | PRN

## 2014-03-28 MED ORDER — PHENYLEPHRINE 8 MG IN D5W 100 ML (0.08MG/ML) PREMIX OPTIME
INJECTION | INTRAVENOUS | Status: AC
  Filled 2014-03-28: qty 100

## 2014-03-28 MED ORDER — SIMETHICONE 80 MG PO CHEW: 80.0000 mg | CHEWABLE_TABLET | ORAL | Status: DC | PRN

## 2014-03-28 MED ORDER — ONDANSETRON HCL 4 MG/2ML IJ SOLN: 4.0000 mg | Freq: Three times a day (TID) | INTRAMUSCULAR | Status: DC | PRN

## 2014-03-28 MED ORDER — MENTHOL 3 MG MT LOZG: 1.0000 | LOZENGE | OROMUCOSAL | Status: DC | PRN

## 2014-03-28 MED ORDER — MORPHINE SULFATE 0.5 MG/ML IJ SOLN
INTRAMUSCULAR | Status: AC
  Filled 2014-03-28: qty 10

## 2014-03-28 MED ORDER — OXYCODONE-ACETAMINOPHEN 5-325 MG PO TABS: 1.0000 | ORAL_TABLET | Freq: Four times a day (QID) | ORAL | Status: DC | PRN

## 2014-03-28 SURGICAL SUPPLY — 26 items
CLAMP CORD UMBIL (MISCELLANEOUS) ×3 IMPLANT
CLOSURE WOUND 1/2 X4 (GAUZE/BANDAGES/DRESSINGS) ×1
CLOTH BEACON ORANGE TIMEOUT ST (SAFETY) ×3 IMPLANT
DRAPE LG THREE QUARTER DISP (DRAPES) IMPLANT
DRSG OPSITE POSTOP 4X10 (GAUZE/BANDAGES/DRESSINGS) ×3 IMPLANT
DURAPREP 26ML APPLICATOR (WOUND CARE) ×3 IMPLANT
ELECT REM PT RETURN 9FT ADLT (ELECTROSURGICAL) ×3
ELECTRODE REM PT RTRN 9FT ADLT (ELECTROSURGICAL) ×1 IMPLANT
EXTRACTOR VACUUM M CUP 4 TUBE (SUCTIONS) IMPLANT
EXTRACTOR VACUUM M CUP 4' TUBE (SUCTIONS)
GLOVE BIO SURGEON STRL SZ7 (GLOVE) ×3 IMPLANT
GOWN STRL REUS W/TWL LRG LVL3 (GOWN DISPOSABLE) ×6 IMPLANT
KIT ABG SYR 3ML LUER SLIP (SYRINGE) ×3 IMPLANT
NEEDLE HYPO 25X5/8 SAFETYGLIDE (NEEDLE) ×3 IMPLANT
NS IRRIG 1000ML POUR BTL (IV SOLUTION) ×3 IMPLANT
PACK C SECTION WH (CUSTOM PROCEDURE TRAY) ×3 IMPLANT
PAD OB MATERNITY 4.3X12.25 (PERSONAL CARE ITEMS) ×3 IMPLANT
STRIP CLOSURE SKIN 1/2X4 (GAUZE/BANDAGES/DRESSINGS) ×2 IMPLANT
SUT CHROMIC 0 CTX 36 (SUTURE) ×9 IMPLANT
SUT MON AB 4-0 PS1 27 (SUTURE) ×3 IMPLANT
SUT PDS AB 0 CT1 27 (SUTURE) ×6 IMPLANT
SUT VIC AB 3-0 CT1 27 (SUTURE) ×6
SUT VIC AB 3-0 CT1 TAPERPNT 27 (SUTURE) ×3 IMPLANT
TOWEL OR 17X24 6PK STRL BLUE (TOWEL DISPOSABLE) ×3 IMPLANT
TRAY FOLEY CATH 14FR (SET/KITS/TRAYS/PACK) ×3 IMPLANT
WATER STERILE IRR 1000ML POUR (IV SOLUTION) ×3 IMPLANT

## 2014-03-28 NOTE — Op Note (Signed)
Preoperative diagnosis: For repeat cesarean section at term  Postoperative diagnosis: Same  Procedure: Repeat low transverse cesarean section  Surgeon: Marcelle OverlieHolland  Anesthesia: Spinal  EBL: 700 cc  Complications: None  Drains: Foley, to straight drain  Procedure and findings:  Patient was taken to the operating room after an adequate level of spinal anesthetic was obtained the patient's supine, the abdomen prepped and draped in the usual fashion Foley catheter positioned. Appropriate timeout for taken at that point. She was prepped and draped transverse incision was made excising the old scar this is carried down to the fascia which was incised and extended transversely. Rectus muscle divided in the midline, peritoneum was then entered superiorly without incident and extended in a vertical fashion. Bladder blade positioned, bladder flap developed by incising the vesicouterine serosa, and dissecting a below with sharp and blunt dissection. Bladder blade was repositioned. Transverse incision made in the lower segment extended with blunt dissection since it was fairly thin clear fluid noted the patient then delivered of a healthy female Apgars 9 and 9, the infant was suctioned, cord clamped and passed the pediatric team for further care. The placenta was then delivered manually intact, uterus exteriorized, cavity wiped clean with a laparotomy pack noted be clean closure obtained the first layer of 0 chromic in a locked fashion followed by an imbricating layer of 0 chromic. This is hemostatic bilateral tubes and ovaries were normal, the bladder flap area was intact and hemostatic. Prior to closure sponge, needle, instrument counts reported as correct x2. Peritoneum was enclose a running 3-0 Vicryl suture, 3-0 Vicryl interrupted sutures used to close the rectus muscles in the midline. A 0 PDS suture was then used from laterally to midline the close the fascia subcutaneous tissue was undermined to reduce  tension this was irrigated and made hemostatic with the Bovie 3-0 Vicryl interrupted sutures used to reapproximate the subcutaneous dead space. 4-0 Monocryl subcuticular closure with Steri-Strips and pressure dressing. Clear urine noted at the end of the case, she tolerated this well went to recovery room in good condition.  Dictated with [facility]  Cristina Armstrong M. Milana ObeyHolland M.D.

## 2014-03-28 NOTE — Progress Notes (Signed)
CTSP: about 2 pm S/P repeat c/s this am. Has had viral gastroenteritis with N/V and diarrhea recently. Was in bed eating when had dizziness and weakness.  Nurse noted BP 80/40 and P=70s. Patient was very pale and UO noted to be small and tea colored.  Nurse started IV, elevated legs When I arrived, color was beginning to return to patient. She denied CP, SOB, cough, leg pain. Lungs CTA, Cor RRR, abdomen soft, LE NT without cords.  Results for orders placed during the hospital encounter of 03/28/14 (from the past 24 hour(s))  PREPARE RBC (CROSSMATCH)     Status: None   Collection Time    03/28/14  6:14 AM      Result Value Ref Range   Order Confirmation ORDER PROCESSED BY BLOOD BANK    TYPE AND SCREEN     Status: None   Collection Time    03/28/14  7:03 AM      Result Value Ref Range   ABO/RH(D) A POS     Antibody Screen NEG     Sample Expiration 03/31/2014     Unit Number [id number]     Blood Component Type RED CELLS,LR     Unit division 00     [facility]     Transfusion Status OK TO TRANSFUSE     Crossmatch Result Compatible     Unit Number [id number]     Blood Component Type RED CELLS,LR     Unit division 00     [facility]     Transfusion Status OK TO TRANSFUSE     Crossmatch Result Compatible     Unit Number [id number]     Blood Component Type RED CELLS,LR     Unit division 00     Status of Unit ALLOCATED     Transfusion Status OK TO TRANSFUSE     Crossmatch Result Compatible     Unit Number [id number]     Blood Component Type RED CELLS,LR     Unit division 00     Status of Unit ALLOCATED     Transfusion Status OK TO TRANSFUSE     Crossmatch Result Compatible    GLUCOSE, CAPILLARY     Status: None   Collection Time    03/28/14  1:57 PM      Result Value Ref Range   Glucose-Capillary 89  70 - 99 mg/dL  CBC     Status: Abnormal   Collection Time    03/28/14  2:12 PM      Result Value Ref Range   WBC 15.5 (*) 4.0  - 10.5 K/uL   RBC 2.96 (*) 3.87 - 5.11 MIL/uL   Hemoglobin 8.1 (*) 12.0 - 15.0 g/dL   HCT 63.824.5 (*) 75.636.0 - 43.346.0 %   MCV 82.8  78.0 - 100.0 fL   MCH 27.4  26.0 - 34.0 pg   MCHC 33.1  30.0 - 36.0 g/dL   RDW 29.516.7 (*) 18.811.5 - 41.615.5 %   Platelets 186  150 - 400 K/uL  COMPREHENSIVE METABOLIC PANEL     Status: Abnormal   Collection Time    03/28/14  2:12 PM      Result Value Ref Range   Sodium 134 (*) 137 - 147 mEq/L   Potassium 4.2  3.7 - 5.3 mEq/L   Chloride 100  96 - 112 mEq/L   CO2 21  19 - 32 mEq/L   Glucose, Bld 89  70 - 99 mg/dL   BUN 14  6 - 23 mg/dL   Creatinine, Ser 1.61  0.50 - 1.10 mg/dL   Calcium 8.6  8.4 - 09.6 mg/dL   Total Protein 5.3 (*) 6.0 - 8.3 g/dL   Albumin 1.9 (*) 3.5 - 5.2 g/dL   AST 12  0 - 37 U/L   ALT 7  0 - 35 U/L   Alkaline Phosphatase 94  39 - 117 U/L   Total Bilirubin <0.2 (*) 0.3 - 1.2 mg/dL   GFR calc non Af Amer 84 (*) >90 mL/min   GFR calc Af Amer >90  >90 mL/min   Anion gap 13  5 - 15  DIC (DISSEMINATED INTRAVASCULAR COAGULATION) PANEL     Status: Abnormal   Collection Time    03/28/14  2:22 PM      Result Value Ref Range   Prothrombin Time 13.1  11.6 - 15.2 seconds   INR 0.99  0.00 - 1.49   aPTT 22 (*) 24 - 37 seconds   Fibrinogen 463  204 - 475 mg/dL   D-Dimer, Quant 0.45 (*) 0.00 - 0.48 ug/mL-FEU   Platelets 191  150 - 400 K/uL   Smear Review NO SCHISTOCYTES SEEN    CBC     Status: Abnormal   Collection Time    03/28/14  2:28 PM      Result Value Ref Range   WBC 15.4 (*) 4.0 - 10.5 K/uL   RBC 2.92 (*) 3.87 - 5.11 MIL/uL   Hemoglobin 8.1 (*) 12.0 - 15.0 g/dL   HCT 40.9 (*) 81.1 - 91.4 %   MCV 82.2  78.0 - 100.0 fL   MCH 27.7  26.0 - 34.0 pg   MCHC 33.8  30.0 - 36.0 g/dL   RDW 78.2 (*) 95.6 - 21.3 %   Platelets 200  150 - 400 K/uL   EKG NSR  O2 sat 98% on RA  IV fluid bolus x 3 liters given.  Now patient feeling much better. No complaints  Filed Vitals:   03/28/14 1748  BP: 118/76  Pulse: 92  Temp: 98.3 F (36.8 C)  Resp:  18    Color good UO more clear  A/P: prob dehydration         Will continue IV fluids overnight

## 2014-03-28 NOTE — Anesthesia Procedure Notes (Signed)

## 2014-03-28 NOTE — Transfer of Care (Signed)
Immediate Anesthesia Transfer of Care Note  Patient: Cristina Armstrong  Procedure(s) Performed: Procedure(s) with comments: CESAREAN SECTION (N/A) - Repeat edc 7/26  Patient Location: PACU  Anesthesia Type:Spinal  Level of Consciousness: awake, alert , oriented and patient cooperative  Airway & Oxygen Therapy: Patient Spontanous Breathing  Post-op Assessment: Report given to PACU RN and Post -op Vital signs reviewed and stable  Post vital signs: Reviewed and stable  Complications: No apparent anesthesia complications

## 2014-03-28 NOTE — Significant Event (Addendum)
Rapid Response Event Note  Overview: Time Called: 1400 Arrival Time: 1405 Event Type: Hypotension  Initial Focused Assessment:   Interventions:   Event Summary: Name of Physician Notified: Dr.Tomblin at 1415    at    Outcome: Stayed in room and stabalized. Minimal, tea colored, urine output. 2nd liter of LR ordered and started  Event End Time: 1555  Mallie DartingSachs, Milta Croson Martha

## 2014-03-28 NOTE — Anesthesia Postprocedure Evaluation (Signed)
  Anesthesia Post-op Note  Patient: Cristina Armstrong  Procedure(s) Performed: Procedure(s) with comments: CESAREAN SECTION (N/A) - Repeat edc 7/26  Patient is awake, responsive, moving her legs, and has signs of resolution of her numbness. Pain and nausea are reasonably well controlled. Vital signs are stable and clinically acceptable. Oxygen saturation is clinically acceptable. There are no apparent anesthetic complications at this time. Patient is ready for discharge.

## 2014-03-28 NOTE — Progress Notes (Signed)
The patient was re-examined with no change in status 

## 2014-03-28 NOTE — Lactation Note (Signed)
This note was copied from the chart of Cristina Helmut Musterlicia Helsley. Lactation Consultation Note  Patient Name: Cristina Armstrong ZOXWR'UToday's Date: 03/28/2014 Reason for consult: Initial assessment of this mother/baby dyad at 13 hours postpartum.  Mom is experienced breastfeeding mother with a 2 and 124 yo at home, both of whom were breastfed.  First child nursed 3 months but with her second, she developed mastitis and stopped after a few weeks.  Mom states that she is familiar with how to hand express her milk.  LC encouraged frequent STS and cue feedings and mom says her newborn has been latching well.  Initial LATCH score=8 and more recent LATCH assessment=9 per RN.  Baby has already had several voids and stools.  Mom is holding baby STS and awaiting first bath and hearing screen.   LC provided [employer] Resource brochure and reviewed [facility] and list of community and web site resources. Mom encouraged to feed baby 8-12 times/24 hours and with feeding cues. LC encouraged review of Baby and Me pp 9, 14 and 20-25 for STS and BF information.  Maternal Data Formula Feeding for Exclusion: No Infant to breast within first hour of birth: Yes (initial LATCH score=8 and baby nursed for 30 minutes) Has patient been taught Hand Expression?: Yes (mom states she is familiar with hand expression technique) Does the patient have breastfeeding experience prior to this delivery?: Yes  Feeding Feeding Type: Breast Fed  LATCH Score/Interventions Latch: Grasps breast easily, tongue down, lips flanged, rhythmical sucking.  Audible Swallowing: A few with stimulation Intervention(s): Skin to skin  Type of Nipple: Everted at rest and after stimulation  Comfort (Breast/Nipple): Soft / non-tender     Hold (Positioning): No assistance needed to correctly position infant at breast.  LATCH Score: 9 (most recent LATCH score, per RN)  Lactation Tools Discussed/Used   STS, cue feedings, hand expression  Consult Status Consult  Status: Follow-up Date: 03/29/14 Follow-up type: In-patient    Warrick ParisianBryant, Cristina Armstrong 03/28/2014, 8:54 PM

## 2014-03-28 NOTE — Progress Notes (Signed)
Pt. Stabilized, A&O, without c/o.

## 2014-03-28 NOTE — Significant Event (Deleted)
Rapid Response Event Note  Overview: Time Called: 1400 Arrival Time: 1405 Event Type: Hypotension  Initial Focused Assessment:   Interventions:   Event Summary: Name of Physician Notified: Dr. Henderson Cloudomblin at 1415    at          Cristina Armstrong, Derrien Anschutz Martha

## 2014-03-28 NOTE — Progress Notes (Signed)
Patient called out stating that she suddenly began feeling lightheaded after sitting up to eat. Patient also feeling clammy, hot, slightly nauseated. Patient is extremely pale. HR 55 BP unable to obtain easily even manually. Had another RN to verify BP, which was 100/55 which is a significant drop. Symptoms continued. Fundus firm, one below the umbilicus, lochia still small and abdomen soft. Called rapid response nurse to assess who started bolus of LR, then called Dr. Henderson Cloudomblin once symptoms did not seem to subside. Labs ordered stat. Dr. Henderson Cloudomblin ordered LR bolus for 30 more minutes and an EKG. Dr. Henderson Cloudomblin states that patient is beginning to feel better and if labs and EKG all seem fine, will keep on MBU; if not, will move to another unit. Rapid Response nurse still in room monitoring continuously.   Cristina Armstrong, Cristina Armstrong

## 2014-03-28 NOTE — Significant Event (Signed)
Rapid Response Event Note:  called by bedside nurse for pt. Stating she felt "dizzy" and "faint" Pt. Hypotensive with HR in the 50's  Overview: Time Called: 1400 Arrival Time: 1405 Event Type: Hypotension  Initial Focused Assessment: pt. Lying flat, hypotensive, very pale, awake, oriented but feeling "Weak" and "puny"   Interventions: CBG=89, O2 @ 2L, LR wide open, labs, EKG.     Event Summary: pt. Color improved after about 500cc of LR and pt. Stated she feel much better. Dr. Henderson Cloudomblin in to see pt. 1L of LR given   at      at          Mallie DartingSachs, Gilmer Kaminsky Martha

## 2014-03-29 ENCOUNTER — Encounter (HOSPITAL_COMMUNITY): Payer: Self-pay | Admitting: Obstetrics and Gynecology

## 2014-03-29 MED ORDER — FERROUS SULFATE 325 (65 FE) MG PO TABS
325.0000 mg | ORAL_TABLET | Freq: Three times a day (TID) | ORAL | Status: DC
  Administered 2014-03-29 – 2014-03-30 (×4): 325 mg via ORAL
  Filled 2014-03-29 (×4): qty 1

## 2014-03-29 NOTE — Progress Notes (Signed)
Subjective: Postpartum Day 1: Cesarean Delivery Patient reports tolerating PO and + flatus.  Ambulated to bathroom this am without dizziness  Objective: Vital signs in last 24 hours: Temp:  [97.5 F (36.4 C)-99.1 F (37.3 C)] 98.5 F (36.9 C) (07/21 0500) Pulse Rate:  [55-120] 98 (07/21 0500) Resp:  [15-23] 18 (07/21 0500) BP: (80-144)/(40-92) 116/75 mmHg (07/21 0500) SpO2:  [95 %-100 %] 95 % (07/21 0500) Weight:  [228 lb (103.42 kg)] 228 lb (103.42 kg) (07/20 0900)  Physical Exam:  General: alert and cooperative Lochia: appropriate Uterine Fundus: firm Incision: abd dressing CDI DVT Evaluation: No evidence of DVT seen on physical exam. Negative Homan's sign. No cords or calf tenderness. No significant calf/ankle edema.   Recent Labs  03/28/14 1412 03/28/14 1428  HGB 8.1* 8.1*  HCT 24.5* 24.0*    Assessment/Plan: Status post Cesarean section. Doing well postoperatively.  DC foley and repeat cbc in am  CURTIS,CAROL G 03/29/2014, 7:46 AM

## 2014-03-29 NOTE — Addendum Note (Signed)
Addendum created 03/29/14 [id number] by Suella Groveoderick C Rexton Greulich, CRNA   Modules edited: Charges VN, Notes Section   Notes Section:  File: [id number]

## 2014-03-29 NOTE — Anesthesia Postprocedure Evaluation (Signed)
  Anesthesia Post-op Note  Patient: Cristina Armstrong  Procedure(s) Performed: Procedure(s) with comments: CESAREAN SECTION (N/A) - Repeat edc 7/26  Patient Location: Mother/Baby  Anesthesia Type:Spinal  Level of Consciousness: awake  Airway and Oxygen Therapy: Patient Spontanous Breathing  Post-op Pain: none  Post-op Assessment: Patient's Cardiovascular Status Stable, Respiratory Function Stable, Patent Airway, No signs of Nausea or vomiting, Adequate PO intake, Pain level controlled, No headache, No backache, No residual numbness and No residual motor weakness  Post-op Vital Signs: Reviewed and stable  Last Vitals:  Filed Vitals:   03/29/14 0500  BP: 116/75  Pulse: 98  Temp: 36.9 C  Resp: 18    Complications: No apparent anesthesia complications

## 2014-03-29 NOTE — Lactation Note (Signed)
This note was copied from the chart of Cristina Helmut Musterlicia Formoso. Lactation Consultation Note  Patient Name: Cristina Armstrong ZOXWR'UToday's Date: 03/29/2014 Reason for consult: Follow-up assessment (per mom the baby recently fed , LC updated doc flow sheets ) Per mom breast feeding is going well , nipples are initially tender with latch. Baby recent fed per mom , so LC was unable to assess Latch. LC reviewed basics - LC suggested - prior to latch breast massage , hand express, latch with cross cradle and then go to cradle  Position with breast compressions until the baby is in a consistent pattern and then intermittent.  LC instructed on use comfort gels,  LC encouraged mom to call for a feeding assessment.  Maternal Data    Feeding Feeding Type: Breast Fed Length of feed: 20 min (per mom )  LATCH Score/Interventions                      Lactation Tools Discussed/Used Tools:  (per mom has a DEBP , Medela t home and manual )   Consult Status Consult Status: Follow-up Date: 03/30/14 Follow-up type: In-patient    Kathrin Greathouseorio, Jayleah Garbers Ann 03/29/2014, 3:01 PM

## 2014-03-30 DIAGNOSIS — Z3490 Encounter for supervision of normal pregnancy, unspecified, unspecified trimester: Secondary | ICD-10-CM

## 2014-03-30 LAB — CBC
HCT: 18.6 % — ABNORMAL LOW (ref 36.0–46.0)
Hemoglobin: 6.1 g/dL — CL (ref 12.0–15.0)
MCH: 27.2 pg (ref 26.0–34.0)
MCHC: 32.8 g/dL (ref 30.0–36.0)
MCV: 83 fL (ref 78.0–100.0)
Platelets: 144 10*3/uL — ABNORMAL LOW (ref 150–400)
RBC: 2.24 MIL/uL — ABNORMAL LOW (ref 3.87–5.11)
RDW: 17.1 % — ABNORMAL HIGH (ref 11.5–15.5)
WBC: 13.6 10*3/uL — AB (ref 4.0–10.5)

## 2014-03-30 MED ORDER — IBUPROFEN 800 MG PO TABS: 800.0000 mg | ORAL_TABLET | Freq: Three times a day (TID) | ORAL | Status: AC | PRN

## 2014-03-30 NOTE — Lactation Note (Signed)
This note was copied from the chart of Cristina Helmut Musterlicia Cossin. Lactation Consultation Note  Patient Name: Cristina Armstrong ZOXWR'UToday's Date: 03/30/2014 Reason for consult: Follow-up assessment Per mom the baby recently fed  0845 for 15 mins. Mom mentioned with 1st 2 baby's milk was delayed coming in , and had to supplement. With her 2nd baby had mastitis x1 .  LC reviewed sore nipple and engorgement prevention and tx if needed. Has a DEBP at home. Mother informed of post-discharge support and given phone number to the lactation department, including services for  phone call assistance; out-patient appointments; and breastfeeding support group. List of other breastfeeding resources  in the community given in the handout. Encouraged mother to call for problems or concerns related to breastfeeding.    Maternal Data    Feeding Feeding Type:  (per mom last fed at 0845 for 15 mins ) Length of feed: 15 min (per mom )  LATCH Score/Interventions          Comfort (Breast/Nipple):  (per mom nipples are alittle tender and manageable )           Lactation Tools Discussed/Used Tools:  (per mom has a DEBP at home ) [facility]   Consult Status Consult Status: Complete Date: 03/30/14    Kathrin Greathouseorio, Samantha Ragen Ann 03/30/2014, 10:49 AM

## 2014-03-30 NOTE — Discharge Summary (Signed)
Obstetric Discharge Summary Reason for Admission: cesarean section Prenatal Procedures: ultrasound Intrapartum Procedures: cesarean: low cervical, transverse Postpartum Procedures: none Complications-Operative and Postpartum: anemia Hemoglobin  Date Value Ref Range Status  03/30/2014 6.1* 12.0 - 15.0 Armstrong/dL Final     REPEATED TO VERIFY     CRITICAL RESULT CALLED TO, READ BACK BY AND VERIFIED WITH:     BRANCH,N @0707  ON [id number] BY FLEMINGS     HCT  Date Value Ref Range Status  03/30/2014 18.6* 36.0 - 46.0 % Final    Physical Exam:  General: alert and cooperative Lochia: appropriate Uterine Fundus: firm Incision: healing well DVT Evaluation: No evidence of DVT seen on physical exam. Negative Homan's sign. No cords or calf tenderness. No significant calf/ankle edema.  Discharge Diagnoses: Term Pregnancy-delivered  Discharge Information: Date: 03/30/2014 Activity: pelvic rest Diet: routine Medications: PNV, Ibuprofen and Iron Condition: stable Instructions: refer to practice specific booklet Discharge to: home   Newborn Data: Live born female  Birth Weight: 7 lb 2.6 oz (3250 Armstrong) APGAR: ,   Home with mother.  Cristina Armstrong 03/30/2014, 8:26 AM

## 2014-03-30 NOTE — Progress Notes (Signed)
Subjective: Postpartum Day 2: Cesarean Delivery Patient reports tolerating PO, + flatus and no problems voiding.  Patient desires discharge today. Denies SOB, HA, or dizziness  Objective: Vital signs in last 24 hours: Temp:  [98.1 F (36.7 C)-98.8 F (37.1 C)] 98.1 F (36.7 C) (07/22 0612) Pulse Rate:  [88-105] 88 (07/22 0612) Resp:  [18-19] 19 (07/22 0612) BP: (128-138)/(60-84) 131/84 mmHg (07/22 0612) SpO2:  [96 %-97 %] 97 % (07/22 0612)  Physical Exam:  General: alert and cooperative Lochia: appropriate Uterine Fundus: firm Incision: honeycomb dressing with old drainage noted on bandage DVT Evaluation: No evidence of DVT seen on physical exam. Negative Homan's sign. No cords or calf tenderness. No significant calf/ankle edema.   Recent Labs  03/28/14 1428 03/30/14 0610  HGB 8.1* 6.1*  HCT 24.0* 18.6*    Assessment/Plan: Status post Cesarean section. Postoperative course complicated by anemia  Discharge home with standard precautions and return to clinic in 2 days . Unable to complete discharge orders secondary to admission orders not complete Jolee Critcher G 03/30/2014, 8:20 AM

## 2014-03-31 LAB — TYPE AND SCREEN
ABO/RH(D): A POS
Antibody Screen: NEGATIVE
UNIT DIVISION: 0
UNIT DIVISION: 0
Unit division: 0
Unit division: 0

## 2014-05-06 ENCOUNTER — Other Ambulatory Visit: Payer: Self-pay | Admitting: Obstetrics and Gynecology

## 2014-05-10 LAB — CYTOLOGY - PAP

## 2014-07-11 ENCOUNTER — Encounter (HOSPITAL_COMMUNITY): Payer: Self-pay | Admitting: Obstetrics and Gynecology

## 2015-05-22 ENCOUNTER — Other Ambulatory Visit: Payer: Self-pay | Admitting: Obstetrics and Gynecology

## 2015-05-23 LAB — CYTOLOGY - PAP
